# Patient Record
Sex: Male | Born: 1985 | Race: Black or African American | Hispanic: No | Marital: Single | Smoking: Current every day smoker
Health system: Southern US, Community
[De-identification: ages and names within clinical notes are randomized; demographics above are authoritative.]

## PROBLEM LIST (undated history)

## (undated) DIAGNOSIS — E119 Type 2 diabetes mellitus without complications: Secondary | ICD-10-CM

## (undated) DIAGNOSIS — I10 Essential (primary) hypertension: Secondary | ICD-10-CM

## (undated) DIAGNOSIS — K5792 Diverticulitis of intestine, part unspecified, without perforation or abscess without bleeding: Secondary | ICD-10-CM

## (undated) HISTORY — DX: Diverticulitis of intestine, part unspecified, without perforation or abscess without bleeding: K57.92

---

## 2016-03-14 ENCOUNTER — Emergency Department: Payer: Self-pay

## 2016-03-14 ENCOUNTER — Inpatient Hospital Stay
Admission: EM | Admit: 2016-03-14 | Discharge: 2016-03-17 | DRG: 305 | Disposition: A | Discharge status: Home / Self Care | Payer: Self-pay | Attending: Internal Medicine | Admitting: Internal Medicine

## 2016-03-14 ENCOUNTER — Encounter: Payer: Self-pay | Admitting: Emergency Medicine

## 2016-03-14 DIAGNOSIS — R778 Other specified abnormalities of plasma proteins: Secondary | ICD-10-CM

## 2016-03-14 DIAGNOSIS — R7989 Other specified abnormal findings of blood chemistry: Secondary | ICD-10-CM

## 2016-03-14 DIAGNOSIS — I160 Hypertensive urgency: Principal | ICD-10-CM | POA: Diagnosis present

## 2016-03-14 DIAGNOSIS — E876 Hypokalemia: Secondary | ICD-10-CM | POA: Diagnosis present

## 2016-03-14 DIAGNOSIS — I209 Angina pectoris, unspecified: Secondary | ICD-10-CM | POA: Diagnosis present

## 2016-03-14 DIAGNOSIS — F17210 Nicotine dependence, cigarettes, uncomplicated: Secondary | ICD-10-CM | POA: Diagnosis present

## 2016-03-14 DIAGNOSIS — R079 Chest pain, unspecified: Secondary | ICD-10-CM

## 2016-03-14 DIAGNOSIS — F1290 Cannabis use, unspecified, uncomplicated: Secondary | ICD-10-CM | POA: Diagnosis present

## 2016-03-14 DIAGNOSIS — Z6841 Body Mass Index (BMI) 40.0 and over, adult: Secondary | ICD-10-CM

## 2016-03-14 DIAGNOSIS — I10 Essential (primary) hypertension: Secondary | ICD-10-CM | POA: Diagnosis present

## 2016-03-14 DIAGNOSIS — Y636 Underdosing and nonadministration of necessary drug, medicament or biological substance: Secondary | ICD-10-CM | POA: Diagnosis present

## 2016-03-14 DIAGNOSIS — E669 Obesity, unspecified: Secondary | ICD-10-CM | POA: Diagnosis present

## 2016-03-14 DIAGNOSIS — F17200 Nicotine dependence, unspecified, uncomplicated: Secondary | ICD-10-CM | POA: Diagnosis present

## 2016-03-14 DIAGNOSIS — E119 Type 2 diabetes mellitus without complications: Secondary | ICD-10-CM | POA: Diagnosis present

## 2016-03-14 HISTORY — DX: Essential (primary) hypertension: I10

## 2016-03-14 LAB — BASIC METABOLIC PANEL
Anion gap: 8 (ref 5–15)
BUN: 13 mg/dL (ref 6–20)
CALCIUM: 9.2 mg/dL (ref 8.9–10.3)
CO2: 30 mmol/L (ref 22–32)
CREATININE: 0.95 mg/dL (ref 0.61–1.24)
Chloride: 101 mmol/L (ref 101–111)
GFR calc non Af Amer: >60 mL/min (ref >60)
Glucose, Bld: 176 mg/dL — ABNORMAL HIGH (ref 65–99)
Potassium: 3 mmol/L — ABNORMAL LOW (ref 3.5–5.1)
SODIUM: 139 mmol/L (ref 135–145)

## 2016-03-14 LAB — CBC
HCT: 43.6 % (ref 40.0–52.0)
Hemoglobin: 15.2 g/dL (ref 13.0–18.0)
MCH: 29 pg (ref 26.0–34.0)
MCHC: 35 g/dL (ref 32.0–36.0)
MCV: 83.1 fL (ref 80.0–100.0)
PLATELETS: 248 10*3/uL (ref 150–440)
RBC: 5.25 MIL/uL (ref 4.40–5.90)
RDW: 13.8 % (ref 11.5–14.5)
WBC: 9.5 10*3/uL (ref 3.8–10.6)

## 2016-03-14 LAB — LIPID PANEL
Cholesterol: 191 mg/dL (ref 0–200)
HDL: 39 mg/dL — AB (ref >40)
LDL CALC: 126 mg/dL — AB (ref 0–99)
Total CHOL/HDL Ratio: 4.9 RATIO
Triglycerides: 128 mg/dL (ref <150)
VLDL: 26 mg/dL (ref 0–40)

## 2016-03-14 LAB — RAPID HIV SCREEN (HIV 1/2 AB+AG)
HIV 1/2 Antibodies: NONREACTIVE
HIV-1 P24 ANTIGEN - HIV24: NONREACTIVE

## 2016-03-14 LAB — PROTIME-INR
INR: 0.95
Prothrombin Time: 12.7 seconds (ref 11.4–15.2)

## 2016-03-14 LAB — TROPONIN I
TROPONIN I: 0.05 ng/mL — AB (ref <0.03)
TROPONIN I: 0.06 ng/mL — AB (ref <0.03)
Troponin I: 0.04 ng/mL (ref <0.03)
Troponin I: 0.04 ng/mL (ref <0.03)

## 2016-03-14 LAB — MAGNESIUM: MAGNESIUM: 1.9 mg/dL (ref 1.7–2.4)

## 2016-03-14 LAB — APTT: APTT: 33 s (ref 24–36)

## 2016-03-14 MED ORDER — ASPIRIN 81 MG PO CHEW: 324.0000 mg | CHEWABLE_TABLET | Freq: Once | ORAL | Status: DC

## 2016-03-14 MED ORDER — ASPIRIN 81 MG PO CHEW
CHEWABLE_TABLET | ORAL | Status: AC
  Administered 2016-03-14: 324 mg via ORAL
  Filled 2016-03-14: qty 4

## 2016-03-14 MED ORDER — SODIUM CHLORIDE 0.9% FLUSH
3.0000 mL | Freq: Two times a day (BID) | INTRAVENOUS | Status: DC
  Administered 2016-03-14 – 2016-03-17 (×6): 3 mL via INTRAVENOUS

## 2016-03-14 MED ORDER — ALPRAZOLAM 0.25 MG PO TABS
0.2500 mg | ORAL_TABLET | Freq: Once | ORAL | Status: AC
  Administered 2016-03-15: 0.25 mg via ORAL
  Filled 2016-03-14: qty 1

## 2016-03-14 MED ORDER — HYDROCHLOROTHIAZIDE 25 MG PO TABS: 25.0000 mg | ORAL_TABLET | Freq: Every day | ORAL | Status: DC

## 2016-03-14 MED ORDER — METOPROLOL TARTRATE 25 MG PO TABS
25.0000 mg | ORAL_TABLET | Freq: Two times a day (BID) | ORAL | Status: DC
  Administered 2016-03-14 (×2): 25 mg via ORAL
  Filled 2016-03-14 (×2): qty 1

## 2016-03-14 MED ORDER — HYDROCHLOROTHIAZIDE 25 MG PO TABS
25.0000 mg | ORAL_TABLET | Freq: Every day | ORAL | Status: DC
  Administered 2016-03-14 – 2016-03-16 (×3): 25 mg via ORAL
  Filled 2016-03-14 (×3): qty 1

## 2016-03-14 MED ORDER — DOCUSATE SODIUM 100 MG PO CAPS: 100.0000 mg | ORAL_CAPSULE | Freq: Two times a day (BID) | ORAL | Status: DC | PRN

## 2016-03-14 MED ORDER — LABETALOL HCL 5 MG/ML IV SOLN
10.0000 mg | Freq: Once | INTRAVENOUS | Status: AC
  Administered 2016-03-14: 10 mg via INTRAVENOUS

## 2016-03-14 MED ORDER — FUROSEMIDE 10 MG/ML IJ SOLN
20.0000 mg | Freq: Once | INTRAMUSCULAR | Status: AC
  Administered 2016-03-14: 20 mg via INTRAVENOUS
  Filled 2016-03-14: qty 4

## 2016-03-14 MED ORDER — LABETALOL HCL 5 MG/ML IV SOLN
5.0000 mg | Freq: Once | INTRAVENOUS | Status: AC
  Administered 2016-03-14: 5 mg via INTRAVENOUS
  Filled 2016-03-14: qty 4

## 2016-03-14 MED ORDER — HYDRALAZINE HCL 20 MG/ML IJ SOLN
10.0000 mg | Freq: Four times a day (QID) | INTRAMUSCULAR | Status: DC | PRN
  Administered 2016-03-14 – 2016-03-15 (×3): 10 mg via INTRAVENOUS
  Filled 2016-03-14 (×3): qty 1

## 2016-03-14 MED ORDER — POTASSIUM CHLORIDE CRYS ER 20 MEQ PO TBCR
40.0000 meq | EXTENDED_RELEASE_TABLET | Freq: Two times a day (BID) | ORAL | Status: DC
  Administered 2016-03-14 – 2016-03-16 (×6): 40 meq via ORAL
  Filled 2016-03-14 (×6): qty 2

## 2016-03-14 MED ORDER — LISINOPRIL 10 MG PO TABS: 10.0000 mg | ORAL_TABLET | Freq: Once | ORAL | Status: DC

## 2016-03-14 MED ORDER — ASPIRIN 81 MG PO CHEW
324.0000 mg | CHEWABLE_TABLET | Freq: Once | ORAL | Status: AC
  Administered 2016-03-14: 324 mg via ORAL

## 2016-03-14 MED ORDER — LABETALOL HCL 5 MG/ML IV SOLN
10.0000 mg | Freq: Once | INTRAVENOUS | Status: AC
  Administered 2016-03-14: 10 mg via INTRAVENOUS
  Filled 2016-03-14: qty 4

## 2016-03-14 MED ORDER — ACETAMINOPHEN 325 MG PO TABS
650.0000 mg | ORAL_TABLET | ORAL | Status: DC | PRN
  Administered 2016-03-14 – 2016-03-15 (×3): 650 mg via ORAL
  Filled 2016-03-14 (×3): qty 2

## 2016-03-14 NOTE — Progress Notes (Signed)
[facility]         [place], [place]   03/14/2016  Patient: Deborha PaymentMichael Lahti   Date of Birth:  [birth year]  Date of admission:  03/14/2016  Date of Discharge  to be decided   To Whom it May Concern:   Deborha PaymentMichael [place]ared[place] is being admitted to hospital for his medical issues.  If you have any questions or concerns, please don't hesitate to call.  Sincerely,   Altamese DillingVACHHANI, Anistyn Graddy M.D Pager [phone]- [phone] Office : [phone] Hospital: [phone]   .

## 2016-03-14 NOTE — ED Provider Notes (Signed)
[facility] Emergency Department Provider Note  ____________________________________________   I have reviewed the triage vital signs and the nursing notes.   HISTORY  Chief Complaint Hypertension    HPI Ricky Farrell is a 31 y.o. male with a history of hypertension who has not been on his blood pressure medications for several months she states because of cost issues presents today complaining of elevated blood pressure. He states he did have a headache over the last couple days. Not worst headache of life but he notes that his blood pressures up when he gets a headache like that. Denies any focal numbness or weakness per her headache is gone this morning he also states that he had a "stitch" in the left side of his chest yesterday evening. It was gone when he woke up this morning. These 2 things however have brought him into the hospital. At this time he has no chest pain or shortness of breath. He states his baseline blood pressures "high" but is not sure exactly how high. Usually starts with a one however.  The "stitch" in his left chest was nonradiating, it was present at rest with no shortness of breath and no pleuritic changes.   Past Medical History:  Diagnosis Date  . Hypertension     There are no active problems to display for this patient.   History reviewed. No pertinent surgical history.  Prior to Admission medications   Not on File    Allergies Patient has no known allergies.  No family history on file.  Social History Social History  Substance Use Topics  . Smoking status: Current Every Day Smoker    Packs/day: 0.10    Types: Cigarettes  . Smokeless tobacco: Not on file  . Alcohol use Not on file    Review of Systems Constitutional: No fever/chills Eyes: No visual changes. ENT: No sore throat. No stiff neck no neck pain Cardiovascular: See history of present illness chest pain. Respiratory: Denies shortness of  breath. Gastrointestinal:   no vomiting.  No diarrhea.  No constipation. Genitourinary: Negative for dysuria. Musculoskeletal: Negative lower extremity swelling Skin: Negative for rash. Neurological: Negative for severe headaches, focal weakness or numbness. 10-point ROS otherwise negative.  ____________________________________________   PHYSICAL EXAM:  VITAL SIGNS: ED Triage Vitals  Enc Vitals Group     BP 03/14/16 0809 (!) 200/139     Pulse Rate 03/14/16 0809 (!) 102     Resp 03/14/16 0809 18     Temp 03/14/16 0809 97.7 F (36.5 C)     Temp Source 03/14/16 0809 Oral     SpO2 03/14/16 0809 98 %     Weight 03/14/16 0810 250 lb (113.4 kg)     Height 03/14/16 0810 5\' 7"  (1.702 m)     Head Circumference --      Peak Flow --      Pain Score 03/14/16 0811 5     Pain Loc --      Pain Edu? --      Excl. in GC? --     Constitutional: Alert and oriented. Well appearing and in no acute distress. Eyes: Conjunctivae are normal. PERRL. EOMI. Head: Atraumatic. Nose: No congestion/rhinnorhea. Mouth/Throat: Mucous membranes are moist.  Oropharynx non-erythematous. Neck: No stridor.   Nontender with no meningismus Cardiovascular: Normal rate, regular rhythm. Grossly normal heart sounds.  Good peripheral circulation. Respiratory: Normal respiratory effort.  No retractions. Lungs CTAB. Abdominal: Soft and nontender. No distention. No guarding no rebound Back:  There is no focal tenderness or step off.  there is no midline tenderness there are no lesions noted. there is no CVA tenderness Musculoskeletal: No lower extremity tenderness, no upper extremity tenderness. No joint effusions, no DVT signs strong distal pulses no edema Neurologic:  Normal speech and language. No gross focal neurologic deficits are appreciated.  Skin:  Skin is warm, dry and intact. No rash noted. Psychiatric: Mood and affect are normal. Speech and behavior are normal.  ____________________________________________    LABS (all labs ordered are listed, but only abnormal results are displayed)  Labs Reviewed  BASIC METABOLIC PANEL - Abnormal; Notable for the following:       Result Value   Potassium 3.0 (*)    Glucose, Bld 176 (*)    All other components within normal limits  TROPONIN I - Abnormal; Notable for the following:    Troponin I 0.05 (*)    All other components within normal limits  CBC  PROTIME-INR  APTT   ____________________________________________  EKG  I personally interpreted any EKGs ordered by me or triage Sinus rhythm rate 94 bpm, ST depression in V4 5 and 6 possible strain pattern also ST depression in 3 and aVF. ____________________________________________  RADIOLOGY  I reviewed any imaging ordered by me or triage that were performed during my shift and, if possible, patient and/or family made aware of any abnormal findings. ____________________________________________   PROCEDURES  Procedure(s) performed: None  Procedures  Critical Care performed: CRITICAL CARE Performed by: Jeanmarie Plant   Total critical care time: 40 minutes  Critical care time was exclusive of separately billable procedures and treating other patients.  Critical care was necessary to treat or prevent imminent or life-threatening deterioration.  Critical care was time spent personally by me on the following activities: development of treatment plan with patient and/or surrogate as well as nursing, discussions with consultants, evaluation of patient's response to treatment, examination of patient, obtaining history from patient or surrogate, ordering and performing treatments and interventions, ordering and review of laboratory studies, ordering and review of radiographic studies, pulse oximetry and re-evaluation of patient's condition.   ____________________________________________   INITIAL IMPRESSION / ASSESSMENT AND PLAN / ED COURSE  Pertinent labs & imaging results that were  available during my care of the patient were reviewed by me and considered in my medical decision making (see chart for details).  Patient with elements of hypertensive urgency. He is not having ongoing chest pain and have an overnight. Blood pressure was initially quite elevated we have brought it down with IV labetalol. Patient has had a headache. He now discussed a CT scan. He is neurologically intact and the stomach for not to have a CT scan. He understands the risk benefits alternatives of both courses of action including having the CT scan not having a CT scan. Patient does have EKG changes. Some of this could be a chronic strain pattern however we do not have any old EKGs here for comparison. Given this and his elevated blood pressure with chest discomfort, although the chest discomfort has resolved, I think the patient meets criteria for hypertensive urgency, we have therefore giving him IV medication Raynaud's blood pressure, we are giving him aspirin and we will admit him. He remains well appearing at this time with no chest pain or headache. Hospitalist agrees with management. There is certainly possibly the the patient's symptoms represents demand ischemia or even ACS.    ____________________________________________   FINAL CLINICAL IMPRESSION(S) / ED DIAGNOSES  Final diagnoses:  None      This chart was dictated using voice recognition software.  Despite best efforts to proofread,  errors can occur which can change meaning.      Jeanmarie PlantJames A McShane, MD 03/14/16 [phone]

## 2016-03-14 NOTE — H&P (Signed)
[facility] [place] [facility]   PATIENT NAME: Ricky Farrell    MR#:  [id number]  DATE OF BIRTH:  [birth year]  DATE OF ADMISSION:  03/14/2016  PRIMARY CARE PHYSICIAN: None.  REQUESTING/REFERRING PHYSICIAN: McShane  CHIEF COMPLAINT:   Chief Complaint  Patient presents with  . Hypertension    HISTORY OF PRESENT ILLNESS: Ricky Farrell  is a 31 y.o. male with a known history of Htn, was prescribed HCTZ and Lisinopril, but stopped lisinopril after reading its side effects online. [place] and so than out of his hydrochlorothiazide and since then has not seen any doctors so for last 3-4 month he is not taking any medicine for his high blood pressure. Lately he is also feeling more weak or tired while doing his workout or playing, last night he was having on and off chest pain on left side of his chest so concerned with this he came to emergency room today. He was noted to have very high blood pressure and borderline elevated troponin so he is given for admission to hospitalist team. He also confirms using marijuana one day before coming here.  PAST MEDICAL HISTORY:   Past Medical History:  Diagnosis Date  . Hypertension     PAST SURGICAL HISTORY: History reviewed. No pertinent surgical history.  SOCIAL HISTORY:  Social History  Substance Use Topics  . Smoking status: Current Every Day Smoker    Packs/day: 0.10    Types: Cigarettes  . Smokeless tobacco: Not on file  . Alcohol use Not on file    FAMILY HISTORY:  Family History  Problem Relation Age of Onset  . Hypertension Mother   . Diabetes Mother     DRUG ALLERGIES: No Known Allergies  REVIEW OF SYSTEMS:   CONSTITUTIONAL: No fever, fatigue or weakness.  EYES: No blurred or double vision.  EARS, NOSE, AND THROAT: No tinnitus or ear pain.  RESPIRATORY: No cough, shortness of breath, wheezing or hemoptysis.  CARDIOVASCULAR: Positive for chest pain, no orthopnea, edema.   GASTROINTESTINAL: No nausea, vomiting, diarrhea or abdominal pain.  GENITOURINARY: No dysuria, hematuria.  ENDOCRINE: No polyuria, nocturia,  HEMATOLOGY: No anemia, easy bruising or bleeding SKIN: No rash or lesion. MUSCULOSKELETAL: No joint pain or arthritis.   NEUROLOGIC: No tingling, numbness, weakness.  PSYCHIATRY: No anxiety or depression.   MEDICATIONS AT HOME:  Prior to Admission medications   Not on File      PHYSICAL EXAMINATION:   VITAL SIGNS: Blood pressure (!) 179/155, pulse 86, temperature 97.7 F (36.5 C), temperature source Oral, resp. rate (!) 22, height 5\' 7"  (1.702 m), weight 113.4 kg (250 lb), SpO2 96 %.  GENERAL:  31 y.o.-year-old patient lying in the bed with no acute distress.  EYES: Pupils equal, round, reactive to light and accommodation. No scleral icterus. Extraocular muscles intact.  HEENT: Head atraumatic, normocephalic. Oropharynx and nasopharynx clear.  NECK:  Supple, no jugular venous distention. No thyroid enlargement, no tenderness.  LUNGS: Normal breath sounds bilaterally, no wheezing, rales,rhonchi or crepitation. No use of accessory muscles of respiration.  CARDIOVASCULAR: S1, S2 normal. No murmurs, rubs, or gallops.  ABDOMEN: Soft, nontender, nondistended. Bowel sounds present. No organomegaly or mass.  EXTREMITIES: No pedal edema, cyanosis, or clubbing.  NEUROLOGIC: Cranial nerves II through XII are intact. Muscle strength 5/5 in all extremities. Sensation intact. Gait not checked.  PSYCHIATRIC: The patient is alert and oriented x 3.  SKIN: No obvious rash, lesion, or ulcer.  LABORATORY PANEL:   CBC  Recent Labs Lab 03/14/16 0826  WBC 9.5  HGB 15.2  HCT 43.6  PLT 248  MCV 83.1  MCH 29.0  MCHC 35.0  RDW 13.8   ------------------------------------------------------------------------------------------------------------------  Chemistries   Recent Labs Lab 03/14/16 0826  NA 139  K 3.0*  CL 101  CO2 30  GLUCOSE 176*  BUN  13  CREATININE 0.95  CALCIUM 9.2   ------------------------------------------------------------------------------------------------------------------ estimated creatinine clearance is 134.2 mL/min (by C-G formula based on SCr of 0.95 mg/dL). ------------------------------------------------------------------------------------------------------------------ No results for input(s): TSH, T4TOTAL, T3FREE, THYROIDAB in the last 72 hours.  Invalid input(s): FREET3   Coagulation profile  Recent Labs Lab 03/14/16 0826  INR 0.95   ------------------------------------------------------------------------------------------------------------------- No results for input(s): DDIMER in the last 72 hours. -------------------------------------------------------------------------------------------------------------------  Cardiac Enzymes  Recent Labs Lab 03/14/16 0826  TROPONINI 0.05*   ------------------------------------------------------------------------------------------------------------------ Invalid input(s): POCBNP  ---------------------------------------------------------------------------------------------------------------  Urinalysis No results found for: COLORURINE, APPEARANCEUR, LABSPEC, PHURINE, GLUCOSEU, HGBUR, BILIRUBINUR, KETONESUR, PROTEINUR, UROBILINOGEN, NITRITE, LEUKOCYTESUR   RADIOLOGY: Dg Chest Port 1 View  Result Date: 03/14/2016 CLINICAL DATA:  Pt reports "cramping" on left side of upper abdomen x3 days, reports increased stress, is out of lisinopril and hydrochlorothiazide. Pt denies CP, SHOB. EXAM: PORTABLE CHEST 1 VIEW COMPARISON:  None. FINDINGS: Heart is enlarged. There are no focal consolidations or pleural effusions. No pulmonary edema. IMPRESSION: Cardiomegaly. Electronically Signed   By: Norva Pavlov M.D.   On: 03/14/2016 09:48    EKG: Orders placed or performed during the hospital encounter of 03/14/16  . ED EKG within 10 minutes  . ED EKG within 10  minutes  . EKG 12-Lead  . EKG 12-Lead  . EKG 12-Lead  . EKG 12-Lead    IMPRESSION AND PLAN:  * Hypertensive urgency   Anginal chest pain secondary to uncontrolled hypertension    IV labetalol given in the ER with some help, I will give IV hydralazine and we'll start on oral hydrochlorothiazide and metoprolol for now.   I counseled him about dietary modifications and he seems to be motivated and told he will follow that.   I will monitor him on telemetry and follow serial troponin.  * Elevated troponin   Likely secondary to hypertensive urgency, follow serial troponin and monitor on telemetry.   Get echocardiogram.  * Possible cardiomyopathy   X-ray chest shows cardiac enlargement without pulmonary edema.   Most likely secondary to long-standing uncontrolled hypertension.   We'll get echocardiogram for further details and adjust medications on discharge according to that.   We'll also check lipid panel and hemoglobin A1c  * Active smoking   Counseled to quit smoking for 4 minutes and offered nicotine patch.  All the records are reviewed and case discussed with ED provider. Management plans discussed with the patient, family and they are in agreement.  CODE STATUS: Full code Code Status History    This patient does not have a recorded code status. Please follow your organizational policy for patients in this situation.       TOTAL TIME TAKING CARE OF THIS PATIENT: 50 minutes.    Altamese Dilling M.D on 03/14/2016   Between 7am to 6pm - Pager - [phone]  After 6pm go to [email or web address] - Social research officer, government  [facility]  Office  [phone]  CC: Primary care physician; No primary care provider on file.   Note: This dictation was prepared with Dragon dictation along with smaller phrase technology. Any transcriptional errors that result from this  process are unintentional.

## 2016-03-14 NOTE — ED Triage Notes (Signed)
States has history of high blood pressure and has been off of meds. Recent move and has not established care.

## 2016-03-14 NOTE — ED Notes (Signed)
Pt reports "cramping" on left side of upper abdomen x3 days, reports increased stress, is out of lisinopril and hydrochlorothiazide. Pt denies CP, SHOB.

## 2016-03-14 NOTE — Progress Notes (Signed)
31 yo bm admitted to room 251 from ED with hypertensive emergency.  A&O x 3, gait steady.  No distress on ra.  Cardiac monitor place on pt and verified with Jamie,CNA.  Pt denies chest pain at this time.  Lungs diminished bil.  SL lt ac flushes well.  Skin intact, checked with Amy, RN.  Oriented to room and surroundings, POC reviewed with pt.  Denies need at this time.  CB in reach, SR up x 2.

## 2016-03-14 NOTE — Plan of Care (Signed)
Problem: Safety: Goal: Ability to remain free from injury will improve Outcome: Progressing Independent in room  Problem: Pain Managment: Goal: General experience of comfort will improve Outcome: Progressing Prn medications

## 2016-03-15 ENCOUNTER — Inpatient Hospital Stay
Admit: 2016-03-15 | Discharge: 2016-03-15 | Disposition: A | Discharge status: Home / Self Care | Payer: Self-pay | Attending: Internal Medicine | Admitting: Internal Medicine

## 2016-03-15 DIAGNOSIS — R079 Chest pain, unspecified: Secondary | ICD-10-CM

## 2016-03-15 LAB — CBC
HEMATOCRIT: 44.8 % (ref 40.0–52.0)
HEMOGLOBIN: 15.6 g/dL (ref 13.0–18.0)
MCH: 29 pg (ref 26.0–34.0)
MCHC: 34.9 g/dL (ref 32.0–36.0)
MCV: 83.1 fL (ref 80.0–100.0)
Platelets: 264 10*3/uL (ref 150–440)
RBC: 5.39 MIL/uL (ref 4.40–5.90)
RDW: 14.3 % (ref 11.5–14.5)
WBC: 11 10*3/uL — AB (ref 3.8–10.6)

## 2016-03-15 LAB — BASIC METABOLIC PANEL
ANION GAP: 9 (ref 5–15)
BUN: 14 mg/dL (ref 6–20)
CHLORIDE: 99 mmol/L — AB (ref 101–111)
CO2: 27 mmol/L (ref 22–32)
Calcium: 9.4 mg/dL (ref 8.9–10.3)
Creatinine, Ser: 0.96 mg/dL (ref 0.61–1.24)
GFR calc Af Amer: >60 mL/min (ref >60)
GLUCOSE: 220 mg/dL — AB (ref 65–99)
POTASSIUM: 3 mmol/L — AB (ref 3.5–5.1)
SODIUM: 135 mmol/L (ref 135–145)

## 2016-03-15 LAB — ECHOCARDIOGRAM COMPLETE
Height: 67 in
WEIGHTICAEL: 4204.8 [oz_av]

## 2016-03-15 LAB — GLUCOSE, CAPILLARY
GLUCOSE-CAPILLARY: 339 mg/dL — AB (ref 65–99)
Glucose-Capillary: 242 mg/dL — ABNORMAL HIGH (ref 65–99)

## 2016-03-15 LAB — HEMOGLOBIN A1C
Hgb A1c MFr Bld: 9.1 % — ABNORMAL HIGH (ref 4.8–5.6)
Mean Plasma Glucose: 214 mg/dL

## 2016-03-15 MED ORDER — CLONIDINE HCL 0.1 MG PO TABS
0.1000 mg | ORAL_TABLET | Freq: Two times a day (BID) | ORAL | Status: DC
  Administered 2016-03-15: 0.1 mg via ORAL
  Filled 2016-03-15 (×2): qty 1

## 2016-03-15 MED ORDER — CLONIDINE HCL 0.1 MG PO TABS
0.2000 mg | ORAL_TABLET | Freq: Once | ORAL | Status: AC
  Administered 2016-03-15: 0.2 mg via ORAL
  Filled 2016-03-15: qty 1

## 2016-03-15 MED ORDER — GLIPIZIDE ER 5 MG PO TB24
5.0000 mg | ORAL_TABLET | Freq: Every day | ORAL | Status: DC
  Administered 2016-03-15 – 2016-03-17 (×3): 5 mg via ORAL
  Filled 2016-03-15 (×3): qty 1

## 2016-03-15 MED ORDER — METOPROLOL TARTRATE 50 MG PO TABS
50.0000 mg | ORAL_TABLET | Freq: Two times a day (BID) | ORAL | Status: DC
  Administered 2016-03-15 – 2016-03-17 (×5): 50 mg via ORAL
  Filled 2016-03-15 (×5): qty 1

## 2016-03-15 MED ORDER — INSULIN ASPART 100 UNIT/ML ~~LOC~~ SOLN
0.0000 [IU] | Freq: Three times a day (TID) | SUBCUTANEOUS | Status: DC
  Administered 2016-03-15: 7 [IU] via SUBCUTANEOUS
  Administered 2016-03-16 (×2): 2 [IU] via SUBCUTANEOUS
  Administered 2016-03-16: 1 [IU] via SUBCUTANEOUS
  Administered 2016-03-17 (×2): 2 [IU] via SUBCUTANEOUS
  Filled 2016-03-15: qty 2
  Filled 2016-03-15: qty 1
  Filled 2016-03-15: qty 2
  Filled 2016-03-15: qty 7
  Filled 2016-03-15 (×2): qty 2

## 2016-03-15 MED ORDER — AMLODIPINE BESYLATE 5 MG PO TABS
5.0000 mg | ORAL_TABLET | Freq: Every day | ORAL | Status: DC
  Administered 2016-03-15: 5 mg via ORAL
  Filled 2016-03-15: qty 1

## 2016-03-15 MED ORDER — PERFLUTREN LIPID MICROSPHERE
1.0000 mL | INTRAVENOUS | Status: AC | PRN
  Administered 2016-03-15: 2 mL via INTRAVENOUS
  Filled 2016-03-15: qty 10

## 2016-03-15 MED ORDER — INSULIN ASPART 100 UNIT/ML ~~LOC~~ SOLN
0.0000 [IU] | Freq: Every day | SUBCUTANEOUS | Status: DC
  Administered 2016-03-15: 2 [IU] via SUBCUTANEOUS
  Filled 2016-03-15 (×2): qty 2

## 2016-03-15 MED ORDER — HYDRALAZINE HCL 20 MG/ML IJ SOLN
20.0000 mg | Freq: Once | INTRAMUSCULAR | Status: AC
  Administered 2016-03-15: 20 mg via INTRAVENOUS
  Filled 2016-03-15: qty 1

## 2016-03-15 NOTE — Progress Notes (Signed)
To echo via bed.

## 2016-03-15 NOTE — Plan of Care (Signed)
Problem: Health Behavior/Discharge Planning: Goal: Ability to manage health-related needs will improve Outcome: Progressing Case management ordered   Problem: Pain Managment: Goal: General experience of comfort will improve Outcome: Progressing Prn medications  Problem: Education: Goal: Ability to describe self-care measures that may prevent or decrease complications (Diabetes Survival Skills Education) will improve Outcome: Progressing Glipizide handout given Diabetes coordinator consult ordered Dietician consult

## 2016-03-15 NOTE — Progress Notes (Signed)
Patient ID: Ricky Farrell, male   DOB: May 26, 1985, 31 y.o.   MRN: [id number]  [facility] PROGRESS NOTE  Ricky Farrell XLK:[id number] DOB: [birth year] DOA: 03/14/2016 PCP: No primary care provider on file.  HPI/Subjective: Patient feels fine. Was admitted with accelerated hypertension. Blood pressure still elevated today. He felt worse after they lowered his blood pressure with IV medication.  Objective: Vitals:   03/15/16 0816 03/15/16 1120  BP: (!) 174/115 (!) 181/120  Pulse: (!) 105 93  Resp: 15 19  Temp: 97.9 F (36.6 C) 98.4 F (36.9 C)    Filed Weights   03/14/16 0810 03/14/16 1349  Weight: 113.4 kg (250 lb) 119.2 kg (262 lb 12.8 oz)    ROS: Review of Systems  Constitutional: Negative for chills and fever.  Eyes: Negative for blurred vision.  Respiratory: Negative for cough and shortness of breath.   Cardiovascular: Negative for chest pain.  Gastrointestinal: Negative for constipation, diarrhea, nausea and vomiting.  Genitourinary: Negative for dysuria.  Musculoskeletal: Negative for joint pain.  Neurological: Positive for headaches. Negative for dizziness.   Exam: Physical Exam  Constitutional: He is oriented to person, place, and time.  HENT:  Nose: No mucosal edema.  Mouth/Throat: No oropharyngeal exudate or posterior oropharyngeal edema.  Eyes: Conjunctivae, EOM and lids are normal. Pupils are equal, round, and reactive to light.  Neck: No JVD present. Carotid bruit is not present. No edema present. No thyroid mass and no thyromegaly present.  Cardiovascular: S1 normal and S2 normal.  Exam reveals no gallop.   No murmur heard. Pulses:      Dorsalis pedis pulses are 2+ on the right side, and 2+ on the left side.  Respiratory: No respiratory distress. He has no wheezes. He has no rhonchi. He has no rales.  GI: Soft. Bowel sounds are normal. There is no tenderness.  Musculoskeletal:       Right ankle: He exhibits no swelling.       Left ankle: He  exhibits no swelling.  Lymphadenopathy:    He has no cervical adenopathy.  Neurological: He is alert and oriented to person, place, and time. No cranial nerve deficit.  Skin: Skin is warm. No rash noted. Nails show no clubbing.  Psychiatric: He has a normal mood and affect.      Data Reviewed: Basic Metabolic Panel:  Recent Labs Lab 03/14/16 0826 03/14/16 1501 03/15/16 0602  NA 139  --  135  K 3.0*  --  3.0*  CL 101  --  99*  CO2 30  --  27  GLUCOSE 176*  --  220*  BUN 13  --  14  CREATININE 0.95  --  0.96  CALCIUM 9.2  --  9.4  MG  --  1.9  --    CBC:  Recent Labs Lab 03/14/16 0826 03/15/16 0602  WBC 9.5 11.0*  HGB 15.2 15.6  HCT 43.6 44.8  MCV 83.1 83.1  PLT 248 264   Cardiac Enzymes:  Recent Labs Lab 03/14/16 0826 03/14/16 1123 03/14/16 1501 03/14/16 1851  TROPONINI 0.05* 0.06* 0.04* 0.04*     Studies: Dg Chest Port 1 View  Result Date: 03/14/2016 CLINICAL DATA:  Pt reports "cramping" on left side of upper abdomen x3 days, reports increased stress, is out of lisinopril and hydrochlorothiazide. Pt denies CP, SHOB. EXAM: PORTABLE CHEST 1 VIEW COMPARISON:  None. FINDINGS: Heart is enlarged. There are no focal consolidations or pleural effusions. No pulmonary edema. IMPRESSION: Cardiomegaly. Electronically Signed   By: Lanora Manis  Manson PasseyBrown M.D.   On: 03/14/2016 09:48    Scheduled Meds: . amLODipine  5 mg Oral Daily  . cloNIDine  0.1 mg Oral BID  . hydrochlorothiazide  25 mg Oral Daily  . metoprolol tartrate  50 mg Oral BID  . potassium chloride  40 mEq Oral BID  . sodium chloride flush  3 mL Intravenous Q12H    Assessment/Plan:  1. Accelerated hypertension. This morning I increase metoprolol to 50 mg twice a day and added amlodipine. This afternoon I added clonidine. Check vitals every 4 hours while awake. Send a renin and aldosterone.  2. Type 2 diabetes mellitus. Hemoglobin A1c elevated at 9.1. Low carbohydrate diet exercise and weight loss explained  to the patient. Start low-dose glipizide 5 mg daily. 3. Hypokalemia replace potassium orally 4. Obesity weight loss needed 5. Patient also needs to stop smoking  Code Status:     Code Status Orders        Start     Ordered   03/14/16 1349  Full code  Continuous     03/14/16 1348    Code Status History    Date Active Date Inactive Code Status Order ID Comments User Context   This patient has a current code status but no historical code status.     Disposition Plan: Home once blood pressure better  Time spent: 30 minutes  Alford HighlandWIETING, Telly Broberg  [employer]

## 2016-03-15 NOTE — Progress Notes (Signed)
Back from echo

## 2016-03-15 NOTE — Progress Notes (Signed)
Called Dr. Tobi BastosPyreddy regarding patient's elevated blood pressure.  Appropriate orders were placed for one time dose of 20mg  of hydralazine.  Arturo MortonClay, Lanee Chain N   03/15/2016  5:09 AM

## 2016-03-16 LAB — GLUCOSE, CAPILLARY
GLUCOSE-CAPILLARY: 199 mg/dL — AB (ref 65–99)
GLUCOSE-CAPILLARY: 160 mg/dL — AB (ref 65–99)
GLUCOSE-CAPILLARY: 200 mg/dL — AB (ref 65–99)
Glucose-Capillary: 121 mg/dL — ABNORMAL HIGH (ref 65–99)
Glucose-Capillary: 122 mg/dL — ABNORMAL HIGH (ref 65–99)

## 2016-03-16 LAB — BASIC METABOLIC PANEL
ANION GAP: 8 (ref 5–15)
BUN: 19 mg/dL (ref 6–20)
CALCIUM: 9.2 mg/dL (ref 8.9–10.3)
CHLORIDE: 100 mmol/L — AB (ref 101–111)
CO2: 28 mmol/L (ref 22–32)
CREATININE: 1.1 mg/dL (ref 0.61–1.24)
GFR calc Af Amer: >60 mL/min (ref >60)
GFR calc non Af Amer: >60 mL/min (ref >60)
GLUCOSE: 177 mg/dL — AB (ref 65–99)
Potassium: 3.2 mmol/L — ABNORMAL LOW (ref 3.5–5.1)
Sodium: 136 mmol/L (ref 135–145)

## 2016-03-16 MED ORDER — SPIRONOLACTONE 25 MG PO TABS
25.0000 mg | ORAL_TABLET | Freq: Every day | ORAL | Status: DC
  Administered 2016-03-16 – 2016-03-17 (×2): 25 mg via ORAL
  Filled 2016-03-16 (×2): qty 1

## 2016-03-16 MED ORDER — GUAIFENESIN-DM 100-10 MG/5ML PO SYRP
5.0000 mL | ORAL_SOLUTION | ORAL | Status: DC | PRN
  Administered 2016-03-16: 5 mL via ORAL
  Filled 2016-03-16: qty 5

## 2016-03-16 MED ORDER — CLONIDINE HCL 0.1 MG PO TABS
0.2000 mg | ORAL_TABLET | Freq: Once | ORAL | Status: AC
  Administered 2016-03-16: 0.2 mg via ORAL

## 2016-03-16 MED ORDER — AMLODIPINE BESYLATE 10 MG PO TABS
10.0000 mg | ORAL_TABLET | Freq: Every day | ORAL | Status: DC
  Administered 2016-03-16: 10 mg via ORAL
  Filled 2016-03-16: qty 1

## 2016-03-16 MED ORDER — LIVING WELL WITH DIABETES BOOK
Freq: Once | Status: AC
  Administered 2016-03-16: 14:00:00
  Filled 2016-03-16: qty 1

## 2016-03-16 MED ORDER — ENALAPRIL MALEATE 10 MG PO TABS
10.0000 mg | ORAL_TABLET | Freq: Two times a day (BID) | ORAL | Status: DC
  Administered 2016-03-16 – 2016-03-17 (×2): 10 mg via ORAL
  Filled 2016-03-16 (×2): qty 1

## 2016-03-16 MED ORDER — CLONIDINE HCL 0.1 MG PO TABS
0.2000 mg | ORAL_TABLET | Freq: Three times a day (TID) | ORAL | Status: DC
  Administered 2016-03-16 – 2016-03-17 (×3): 0.2 mg via ORAL
  Filled 2016-03-16 (×5): qty 2

## 2016-03-16 MED ORDER — ENALAPRIL MALEATE 5 MG PO TABS
5.0000 mg | ORAL_TABLET | Freq: Two times a day (BID) | ORAL | Status: DC
  Administered 2016-03-16: 5 mg via ORAL
  Filled 2016-03-16: qty 1

## 2016-03-16 NOTE — Progress Notes (Signed)
Pt complaining of congestion and sneezing/cough, Guaifenesin has been ordered and administered per standing order, will keep monitoring.

## 2016-03-16 NOTE — Progress Notes (Signed)
Nutrition Education Brief Note   RD consulted for nutrition education regarding diabetic diet and low sodium diet.  RD provided "Low Sodium Nutrition Therapy" and "Carbohydrate Counting for People with Diabetes"  handouts from the Academy of Nutrition and Dietetics. Reviewed patient's dietary recall.   Pt reports consuming 1 large meal a day (up to a whole pizza at 10 PM) and small snacks d/t working night shifts. Encouraged patient to consume small meals with protein throughout the day to not only increase satiation, but also aid in blood sugar maintenance.   Pt reports no recent weight loss and a great appetite.   Pt is new onset diabetic so provided an overview on carbohydrate counting. Discussed amount of grams in a serving of carbohydrates and recommended servings at each meal and snack  Discussed with pt the meals he has been provided since admission are a great reference in portion size. Pt states he has enjoyed the food and "hasn't been starving".  Provided examples on ways to decrease sodium intake in diet. Discouraged intake of processed foods and fast foods. Encouraged fresh fruits and vegetables as well as whole grain sources of carbohydrates to maximize fiber intake.   RD discussed high salt content foods to avoid, focusing on fresh foods, eating processed and prepared foods less often, cooking more often at home, using salt-free seasonings, herbs, and spices to flavor foods, and reading food labels. Teach back method used.  Expect good compliance. Pt's mom is a diabetic and he already discussed a plan with her. Pt was asking relevant questions. Pt states he needs to make changes and has already been thinking of a plan to change once discharged. Pt is planning to follow-up with outpatient RD services.  Body mass index is 41.2 kg/m. Pt meets criteria for morbidly obese based on current BMI.  Summary of recommendations: 1) Introduced carbohydrate counting, discussed amount of grams  in a serving and recommended servings at each meal and snack 2) Decrease portion size - recommended he pay attention to the meals he is provided during admission 3) Recommend small frequent meals to decrease spikes in blood sugars 4) Decrease frequency of fast food consumption and sugary beverages 5) Discussed differences in fresh/frozen foods vs. canned foods for sodium content  6) Use low sodium canned foods and out to eat food choices by checking the menu/food label ahead of time 7) Explained why decreasing sodium and monitoring carbohydrate levels are important to the patient's health   Current diet order is heart healthy/carbohydrate modified, patient is consuming approximately 100% of meals at this time. No further nutrition interventions warranted at this time. If additional nutrition issues arise, please re-consult RD.  Fransisca KaufmannAllison Ioannides Dietetic Intern

## 2016-03-16 NOTE — Progress Notes (Signed)
Patient BP elevated. No IV prn meds. MD notified. Verbal order to give extra dose of clonidine. Will monitor,

## 2016-03-16 NOTE — Progress Notes (Signed)
Inpatient Diabetes Program Recommendations  AACE/ADA: New Consensus Statement on Inpatient Glycemic Control (2015)  Target Ranges:  Prepandial:   less than 140 mg/dL      Peak postprandial:   less than 180 mg/dL (1-2 hours)      Critically ill patients:  140 - 180 mg/dL   Lab Results  Component Value Date   GLUCAP 199 (H) 03/16/2016   HGBA1C 9.1 (H) 03/14/2016    Review of Glycemic Control:  Results for Deborha PaymentNGLE, Rourke (MRN [id number]) as of 03/16/2016 09:35  Ref. Range 03/15/2016 16:41 03/15/2016 21:07 03/16/2016 03:54 03/16/2016 07:42  Glucose-Capillary Latest Ref Range: 65 - 99 mg/dL 409339 (H) 811242 (H) 914121 (H) 199 (H)    Diabetes history: New onset diabetes Outpatient Diabetes medications: None Current orders for Inpatient glycemic control: Novolog sensitive tid with meals and HS, Glucotrol XL 5 mg with breakfast  Inpatient Diabetes Program Recommendations:    Ordered Living Well with diabetes booklet and dietician consult for diet teaching.  May consider also adding Metformin 500 mg bid.   Spoke with patient regarding new diagnosis of diabetes.  Further discussed complications of diabetes and the importance of controlling A1C, Blood pressure and cholesterol.  Discussed A1C results and that it indicates average blood sugars around 213 mg/dL.  Patient seems eager to go home.  Discussed meter and monitoring- told him and girlfriend about glucose meter from [employer] that is approximately 10$.  He needs to follow-up with PCP.  Currently patient does not have insurance.  Note that patient has been seen by dietician.  Will follow.  Thanks, Beryl MeagerJenny Carleton [place], RN, BC-ADM Inpatient Diabetes Coordinator Pager [phone] (8a-5p)

## 2016-03-16 NOTE — Care Management Note (Addendum)
Case Management Note  Patient Details  Name: Kaylum Shrum MRN: [id number] Date of Birth: 1985-04-22  Subjective/Objective:  Met with patient to discuss medication management clinic and [facility]. Application given for both with full explanation on how to access these resources. E-mail sent to [employer] and Zoila Shutter. Verified address and phone number.  Patient was appreciative of assistance.                   Action/Plan:   Expected Discharge Date:                  Expected Discharge Plan:  Home/Self Care  In-House Referral:     Discharge [facility], Medication Assistance  Post Acute Care Choice:    Choice offered to:  Patient  DME Arranged:    DME Agency:     HH Arranged:    [place] Agency:     Status of Service:  Completed, signed off  If discussed at [employer] of Stay Meetings, dates discussed:    Additional Comments:  Jolly Mango, RN 03/16/2016, 2:40 PM

## 2016-03-16 NOTE — Progress Notes (Signed)
Patient ID: Ricky Farrell, male   DOB: 1985-01-28, 31 y.o.   MRN: [id number]  [facility] PROGRESS NOTE  Ricky Farrell JXB:[id number] DOB: 10-11-1985 DOA: 03/14/2016 PCP: No primary care provider on file.  HPI/Subjective: Patient again feels fine but his blood pressure has been resistant to medications added so far. He offers no complaints. He was asking how long he needs to stay in the hospital.  Objective: Vitals:   03/16/16 1111 03/16/16 1113  BP: (!) 167/119 (!) 164/117  Pulse: 91 87  Resp:    Temp:      Filed Weights   03/14/16 0810 03/14/16 1349  Weight: 113.4 kg (250 lb) 119.2 kg (262 lb 12.8 oz)    ROS: Review of Systems  Constitutional: Negative for chills and fever.  Eyes: Negative for blurred vision.  Respiratory: Negative for cough and shortness of breath.   Cardiovascular: Negative for chest pain.  Gastrointestinal: Negative for abdominal pain, constipation, diarrhea, nausea and vomiting.  Genitourinary: Negative for dysuria.  Musculoskeletal: Negative for joint pain.  Neurological: Negative for dizziness and headaches.   Exam: Physical Exam  Constitutional: He is oriented to person, place, and time.  HENT:  Nose: No mucosal edema.  Mouth/Throat: No oropharyngeal exudate or posterior oropharyngeal edema.  Eyes: Conjunctivae, EOM and lids are normal. Pupils are equal, round, and reactive to light.  Neck: No JVD present. Carotid bruit is not present. No edema present. No thyroid mass and no thyromegaly present.  Cardiovascular: S1 normal and S2 normal.  Exam reveals no gallop.   No murmur heard. Pulses:      Dorsalis pedis pulses are 2+ on the right side, and 2+ on the left side.  Respiratory: No respiratory distress. He has no wheezes. He has no rhonchi. He has no rales.  GI: Soft. Bowel sounds are normal. There is no tenderness.  Musculoskeletal:       Right ankle: He exhibits no swelling.       Left ankle: He exhibits no swelling.   Lymphadenopathy:    He has no cervical adenopathy.  Neurological: He is alert and oriented to person, place, and time. No cranial nerve deficit.  Skin: Skin is warm. No rash noted. Nails show no clubbing.  Psychiatric: He has a normal mood and affect.      Data Reviewed: Basic Metabolic Panel:  Recent Labs Lab 03/14/16 0826 03/14/16 1501 03/15/16 0602 03/16/16 0729  NA 139  --  135 136  K 3.0*  --  3.0* 3.2*  CL 101  --  99* 100*  CO2 30  --  27 28  GLUCOSE 176*  --  220* 177*  BUN 13  --  14 19  CREATININE 0.95  --  0.96 1.10  CALCIUM 9.2  --  9.4 9.2  MG  --  1.9  --   --    CBC:  Recent Labs Lab 03/14/16 0826 03/15/16 0602  WBC 9.5 11.0*  HGB 15.2 15.6  HCT 43.6 44.8  MCV 83.1 83.1  PLT 248 264   Cardiac Enzymes:  Recent Labs Lab 03/14/16 0826 03/14/16 1123 03/14/16 1501 03/14/16 1851  TROPONINI 0.05* 0.06* 0.04* 0.04*     Scheduled Meds: . amLODipine  10 mg Oral Daily  . cloNIDine  0.2 mg Oral TID  . enalapril  10 mg Oral BID  . glipiZIDE  5 mg Oral Q breakfast  . hydrochlorothiazide  25 mg Oral Daily  . insulin aspart  0-5 Units Subcutaneous QHS  . insulin  aspart  0-9 Units Subcutaneous TID WC  . living well with diabetes book   Does not apply Once  . metoprolol tartrate  50 mg Oral BID  . potassium chloride  40 mEq Oral BID  . sodium chloride flush  3 mL Intravenous Q12H  . spironolactone  25 mg Oral Daily    Assessment/Plan:  1. Accelerated hypertension. This morning I increased her Norvasc to 10 mg daily. I added low-dose enalapril. This afternoon I added low dose spironolactone. and added amlodipine. Check vitals every 4 hours while awake. Sent renin and aldosterone. Patient felt worse after IV medications given in the emergency room dropping his blood pressure down too quickly. No IV medications for this patient since the patient's blood pressure is been high for a long period of time. 2. Type 2 diabetes mellitus. Hemoglobin A1c  elevated at 9.1. Low carbohydrate diet exercise and weight loss explained to the patient. Started low-dose glipizide 5 mg daily. 3. Hypokalemia replace potassium orally. Check magnesium 4. Obesity weight loss needed 5. Patient also needs to stop smoking  Code Status:     Code Status Orders        Start     Ordered   03/14/16 1349  Full code  Continuous     03/14/16 1348    Code Status History    Date Active Date Inactive Code Status Order ID Comments User Context   This patient has a current code status but no historical code status.     Disposition Plan: Home once blood pressure better  Time spent: 25 minutes  Alford HighlandWIETING, Ricky Farrell  [employer]

## 2016-03-16 NOTE — Plan of Care (Signed)
Problem: Education: Goal: Ability to describe self-care measures that may prevent or decrease complications (Diabetes Survival Skills Education) will improve Outcome: Progressing Early teaching by diabetes nurse and myself.  Needs repetition.

## 2016-03-16 NOTE — Progress Notes (Signed)
Patient is very anxious and concerned by his dual diagnosis of HTN and diabetes.  Requires a lot of reassurance that both problem can be brought back into normal ranges.  He is, however, very motivated to make lifestyle changes to improve his HTN and keep his diabetes under control.  He went through the first 2 chapters of LIVING WITH DIABETES.  He found it informative but also frightening.

## 2016-03-17 LAB — BASIC METABOLIC PANEL
ANION GAP: 8 (ref 5–15)
BUN: 21 mg/dL — ABNORMAL HIGH (ref 6–20)
CHLORIDE: 101 mmol/L (ref 101–111)
CO2: 28 mmol/L (ref 22–32)
Calcium: 9.5 mg/dL (ref 8.9–10.3)
Creatinine, Ser: 1.01 mg/dL (ref 0.61–1.24)
GFR calc Af Amer: >60 mL/min (ref >60)
GFR calc non Af Amer: >60 mL/min (ref >60)
GLUCOSE: 148 mg/dL — AB (ref 65–99)
POTASSIUM: 3.5 mmol/L (ref 3.5–5.1)
Sodium: 137 mmol/L (ref 135–145)

## 2016-03-17 LAB — GLUCOSE, CAPILLARY
Glucose-Capillary: 157 mg/dL — ABNORMAL HIGH (ref 65–99)
Glucose-Capillary: 157 mg/dL — ABNORMAL HIGH (ref 65–99)

## 2016-03-17 MED ORDER — GLIPIZIDE ER 5 MG PO TB24: 5.0000 mg | ORAL_TABLET | Freq: Every day | ORAL | 0 refills | Status: DC

## 2016-03-17 MED ORDER — METOPROLOL TARTRATE 50 MG PO TABS: 50.0000 mg | ORAL_TABLET | Freq: Two times a day (BID) | ORAL | 0 refills | Status: DC

## 2016-03-17 MED ORDER — ENALAPRIL MALEATE 10 MG PO TABS: 10.0000 mg | ORAL_TABLET | Freq: Two times a day (BID) | ORAL | 0 refills | Status: DC

## 2016-03-17 MED ORDER — POTASSIUM CHLORIDE CRYS ER 20 MEQ PO TBCR
40.0000 meq | EXTENDED_RELEASE_TABLET | Freq: Once | ORAL | Status: AC
  Administered 2016-03-17: 40 meq via ORAL
  Filled 2016-03-17: qty 2

## 2016-03-17 MED ORDER — AMLODIPINE BESYLATE 5 MG PO TABS: 5.0000 mg | ORAL_TABLET | Freq: Every day | ORAL | 0 refills | Status: DC

## 2016-03-17 MED ORDER — AMLODIPINE BESYLATE 5 MG PO TABS
5.0000 mg | ORAL_TABLET | Freq: Every day | ORAL | Status: DC
  Administered 2016-03-17: 5 mg via ORAL
  Filled 2016-03-17: qty 1

## 2016-03-17 MED ORDER — CLONIDINE HCL 0.1 MG PO TABS
0.2000 mg | ORAL_TABLET | Freq: Two times a day (BID) | ORAL | Status: DC
  Administered 2016-03-17: 0.2 mg via ORAL
  Filled 2016-03-17: qty 2

## 2016-03-17 MED ORDER — CLONIDINE HCL 0.1 MG PO TABS: 0.1000 mg | ORAL_TABLET | Freq: Two times a day (BID) | ORAL | Status: DC

## 2016-03-17 MED ORDER — SPIRONOLACTONE 25 MG PO TABS: 25.0000 mg | ORAL_TABLET | Freq: Every day | ORAL | 0 refills | Status: DC

## 2016-03-17 MED ORDER — CLONIDINE HCL 0.2 MG PO TABS: 0.2000 mg | ORAL_TABLET | Freq: Two times a day (BID) | ORAL | 0 refills | Status: DC

## 2016-03-17 NOTE — Progress Notes (Signed)
Spoke again with patient regarding diabetes diagnosis.  He states that he plans to lose weight and get back in the gym.  Briefly discussed foot care, monitoring (and gave handout on generic meter purchase), normal blood sugar values, and the importance of follow up. Patient seemed motivated to make changes.  He states he is scared of the complications of diabetes.  I explained that by controlling his diabetes, he can reduce the chance of complications.  He is appreciative of information.    Thanks, Beryl MeagerJenny Worthy Boschert, RN, BC-ADM Inpatient Diabetes Coordinator Pager [phone] (8a-5p)

## 2016-03-17 NOTE — Progress Notes (Signed)
Patient ID: Ricky PaymentMichael Hackleman, male   DOB: [birth year], 31 y.o.   MRN: [id number] [facility] - [place] [facility]        Ricky Farrell was admitted to the Hospital on 03/14/2016 and Discharged  03/17/2016 and should be excused from work/school   for 4  days starting 03/14/2016 , may return to work/school without any restrictions.  Alford HighlandWIETING, Laurena Valko M.D on 03/17/2016,at 3:54 PM  [facility] -  at [facility]    Office  [phone]

## 2016-03-17 NOTE — Discharge Summary (Signed)
[facility] - [place] at [facility]   PATIENT NAME: Ricky Farrell    MR#:  [id number]  DATE OF BIRTH:  July 14, 1985  DATE OF ADMISSION:  03/14/2016 ADMITTING PHYSICIAN: Altamese Dilling, MD  DATE OF DISCHARGE: 03/17/2016  PRIMARY CARE PHYSICIAN: [facility]    ADMISSION DIAGNOSIS:  Hypertensive urgency [I16.0] Troponin I above reference range [R74.8] Chest pain [R07.9]  DISCHARGE DIAGNOSIS:  Active Problems:   Malignant essential hypertension   Chest pain   Hypertensive urgency   SECONDARY DIAGNOSIS:   Past Medical History:  Diagnosis Date  . Hypertension     HOSPITAL COURSE:   1. Accelerated hypertension. Blood pressure last night was actually too low. So I had to cut back on medications this morning. When blood pressure was elevated I had a restart some of the medications. At home the patient takes no medications. The patient will end up going home on Aldactone, enalapril, Norvasc, clonidine and metoprolol. Blood pressure was resistant at first coming down but once Aldactone was added it seemed to come down better. Renin and aldosterone level pending. 2. Type 2 diabetes mellitus. Hemoglobin A1c elevated at 9.1. Low carbohydrate diet, exercise and weight loss explained to the patient. Started low-dose glipizide. He already bought a glucometer. 3. Hypokalemia. Renin and aldosterone sent off and pending. The patient was initially placed on hydrochlorothiazide but I stopped this medication with the hypokalemia. 4. Obesity weight loss needed 5. Patient also needs to stop smoking  DISCHARGE CONDITIONS:   Satisfactory  CONSULTS OBTAINED:   none  DRUG ALLERGIES:  No Known Allergies  DISCHARGE MEDICATIONS:   Current Discharge Medication List    START taking these medications   Details  amLODipine (NORVASC) 5 MG tablet Take 1 tablet (5 mg total) by mouth daily. Qty: 30 tablet, Refills: 0    cloNIDine (CATAPRES) 0.2 MG tablet Take 1 tablet (0.2  mg total) by mouth 2 (two) times daily. Qty: 60 tablet, Refills: 0    enalapril (VASOTEC) 10 MG tablet Take 1 tablet (10 mg total) by mouth 2 (two) times daily. Qty: 60 tablet, Refills: 0    glipiZIDE (GLUCOTROL XL) 5 MG 24 hr tablet Take 1 tablet (5 mg total) by mouth daily with breakfast. Qty: 30 tablet, Refills: 0    metoprolol (LOPRESSOR) 50 MG tablet Take 1 tablet (50 mg total) by mouth 2 (two) times daily. Qty: 60 tablet, Refills: 0    spironolactone (ALDACTONE) 25 MG tablet Take 1 tablet (25 mg total) by mouth daily. Qty: 30 tablet, Refills: 0         DISCHARGE INSTRUCTIONS:   [facility] in about 2 weeks.  If you experience worsening of your admission symptoms, develop shortness of breath, life threatening emergency, suicidal or homicidal thoughts you must seek medical attention immediately by calling 911 or calling your MD immediately  if symptoms less severe.  You Must read complete instructions/literature along with all the possible adverse reactions/side effects for all the Medicines you take and that have been prescribed to you. Take any new Medicines after you have completely understood and accept all the possible adverse reactions/side effects.   Please note  You were cared for by a hospitalist during your hospital stay. If you have any questions about your discharge medications or the care you received while you were in the hospital after you are discharged, you can call the unit and asked to speak with the hospitalist on call if the hospitalist that took  care of you is not available. Once you are discharged, your primary care physician will handle any further medical issues. Please note that NO REFILLS for any discharge medications will be authorized once you are discharged, as it is imperative that you return to your primary care physician (or establish a relationship with a primary care physician if you do not have one) for your aftercare needs so  that they can reassess your need for medications and monitor your lab values.    Today   CHIEF COMPLAINT:   Chief Complaint  Patient presents with  . Hypertension    HISTORY OF PRESENT ILLNESS:  Ricky Farrell  is a 31 y.o. male with a known history of Hypertension presents with elevated blood pressure   VITAL SIGNS:  Blood pressure (!) 145/105, pulse 83, temperature 98.4 F (36.9 C), resp. rate 18, height 5\' 7"  (1.702 m), weight 119.2 kg (262 lb 12.8 oz), SpO2 99 %. One blood pressure the reading of 107/70. I wanted to avoid too low of blood pressure too quickly in this patient. So I cut back medications and then had a restart some medications. Continue follow-up as outpatient.   PHYSICAL EXAMINATION:  GENERAL:  31 y.o.-year-old patient lying in the bed with no acute distress.  EYES: Pupils equal, round, reactive to light and accommodation. No scleral icterus. Extraocular muscles intact.  HEENT: Head atraumatic, normocephalic. Oropharynx and nasopharynx clear.  NECK:  Supple, no jugular venous distention. No thyroid enlargement, no tenderness.  LUNGS: Normal breath sounds bilaterally, no wheezing, rales,rhonchi or crepitation. No use of accessory muscles of respiration.  CARDIOVASCULAR: S1, S2 normal. No murmurs, rubs, or gallops.  ABDOMEN: Soft, non-tender, non-distended. Bowel sounds present. No organomegaly or mass.  EXTREMITIES: Trace edema, no cyanosis, or clubbing.  NEUROLOGIC: Cranial nerves II through XII are intact. Muscle strength 5/5 in all extremities. Sensation intact. Gait not checked.  PSYCHIATRIC: The patient is alert and oriented x 3.  SKIN: No obvious rash, lesion, or ulcer.   DATA REVIEW:   CBC  Recent Labs Lab 03/15/16 0602  WBC 11.0*  HGB 15.6  HCT 44.8  PLT 264    Chemistries   Recent Labs Lab 03/14/16 1501  03/17/16 0316  NA  --   < > 137  K  --   < > 3.5  CL  --   < > 101  CO2  --   < > 28  GLUCOSE  --   < > 148*  BUN  --   < > 21*   CREATININE  --   < > 1.01  CALCIUM  --   < > 9.5  MG 1.9  --   --   < > = values in this interval not displayed.  Cardiac Enzymes  Recent Labs Lab 03/14/16 1851  TROPONINI 0.04*    Management plans discussed with the patient, and he is in agreement.  CODE STATUS:     Code Status Orders        Start     Ordered   03/14/16 1349  Full code  Continuous     03/14/16 1348    Code Status History    Date Active Date Inactive Code Status Order ID Comments User Context   This patient has a current code status but no historical code status.      TOTAL TIME TAKING CARE OF THIS PATIENT: 35 minutes.    Alford HighlandWIETING, Miarose Lippert M.D on 03/17/2016 at 3:53 PM  Between 7am to 6pm -  Pager - [phone]  After 6pm go to [email or web address] - password [email or web address] [facility]  [facility] Office  [phone]  CC: Primary care physician; Open or clinic

## 2016-03-17 NOTE — Progress Notes (Signed)
BP is significantly improved, but not yet normal.  [facility].  Stressed the importance of this followup.  At this time he is very motivated to tackle his illnesses.  Discharged to home.

## 2016-03-19 LAB — ALDOSTERONE + RENIN ACTIVITY W/ RATIO
ALDO / PRA Ratio: 11.2 (ref 0.0–30.0)
Aldosterone: 13.3 ng/dL (ref 0.0–30.0)
PRA LC/MS/MS: 1.191 ng/mL/hr (ref 0.167–5.380)

## 2016-04-10 ENCOUNTER — Emergency Department: Payer: Medicaid Other

## 2016-04-10 ENCOUNTER — Encounter: Payer: Self-pay | Admitting: Emergency Medicine

## 2016-04-10 ENCOUNTER — Emergency Department
Admission: EM | Admit: 2016-04-10 | Discharge: 2016-04-10 | Disposition: A | Discharge status: Home / Self Care | Payer: Medicaid Other | Attending: Emergency Medicine | Admitting: Emergency Medicine

## 2016-04-10 DIAGNOSIS — Z79899 Other long term (current) drug therapy: Secondary | ICD-10-CM | POA: Insufficient documentation

## 2016-04-10 DIAGNOSIS — I10 Essential (primary) hypertension: Secondary | ICD-10-CM | POA: Insufficient documentation

## 2016-04-10 DIAGNOSIS — K5732 Diverticulitis of large intestine without perforation or abscess without bleeding: Secondary | ICD-10-CM

## 2016-04-10 DIAGNOSIS — F17210 Nicotine dependence, cigarettes, uncomplicated: Secondary | ICD-10-CM | POA: Insufficient documentation

## 2016-04-10 LAB — URINALYSIS, COMPLETE (UACMP) WITH MICROSCOPIC
BILIRUBIN URINE: NEGATIVE
Bacteria, UA: NONE SEEN
Glucose, UA: NEGATIVE mg/dL
Ketones, ur: NEGATIVE mg/dL
LEUKOCYTES UA: NEGATIVE
NITRITE: NEGATIVE
PH: 5 (ref 5.0–8.0)
Protein, ur: 100 mg/dL — AB
RBC / HPF: NONE SEEN RBC/hpf (ref 0–5)
SPECIFIC GRAVITY, URINE: 1.023 (ref 1.005–1.030)
Squamous Epithelial / LPF: NONE SEEN

## 2016-04-10 LAB — CBC
HEMATOCRIT: 41 % (ref 40.0–52.0)
Hemoglobin: 14.2 g/dL (ref 13.0–18.0)
MCH: 29.4 pg (ref 26.0–34.0)
MCHC: 34.7 g/dL (ref 32.0–36.0)
MCV: 84.8 fL (ref 80.0–100.0)
PLATELETS: 288 10*3/uL (ref 150–440)
RBC: 4.84 MIL/uL (ref 4.40–5.90)
RDW: 14 % (ref 11.5–14.5)
WBC: 16.1 10*3/uL — AB (ref 3.8–10.6)

## 2016-04-10 LAB — COMPREHENSIVE METABOLIC PANEL
ALT: 31 U/L (ref 17–63)
AST: 23 U/L (ref 15–41)
Albumin: 4.1 g/dL (ref 3.5–5.0)
Alkaline Phosphatase: 77 U/L (ref 38–126)
Anion gap: 7 (ref 5–15)
BILIRUBIN TOTAL: 0.6 mg/dL (ref 0.3–1.2)
BUN: 19 mg/dL (ref 6–20)
CO2: 27 mmol/L (ref 22–32)
CREATININE: 1.09 mg/dL (ref 0.61–1.24)
Calcium: 9.1 mg/dL (ref 8.9–10.3)
Chloride: 103 mmol/L (ref 101–111)
Glucose, Bld: 104 mg/dL — ABNORMAL HIGH (ref 65–99)
POTASSIUM: 3.4 mmol/L — AB (ref 3.5–5.1)
Sodium: 137 mmol/L (ref 135–145)
TOTAL PROTEIN: 7.7 g/dL (ref 6.5–8.1)

## 2016-04-10 LAB — LIPASE, BLOOD: Lipase: 13 U/L (ref 11–51)

## 2016-04-10 MED ORDER — OXYCODONE-ACETAMINOPHEN 5-325 MG PO TABS
2.0000 | ORAL_TABLET | Freq: Once | ORAL | Status: AC
  Administered 2016-04-10: 2 via ORAL
  Filled 2016-04-10: qty 2

## 2016-04-10 MED ORDER — METRONIDAZOLE 500 MG PO TABS
500.0000 mg | ORAL_TABLET | Freq: Once | ORAL | Status: AC
  Administered 2016-04-10: 500 mg via ORAL
  Filled 2016-04-10: qty 1

## 2016-04-10 MED ORDER — CIPROFLOXACIN HCL 500 MG PO TABS: 500.0000 mg | ORAL_TABLET | Freq: Two times a day (BID) | ORAL | 0 refills | Status: AC

## 2016-04-10 MED ORDER — ONDANSETRON HCL 4 MG/2ML IJ SOLN
4.0000 mg | Freq: Once | INTRAMUSCULAR | Status: AC
  Administered 2016-04-10: 4 mg via INTRAVENOUS
  Filled 2016-04-10: qty 2

## 2016-04-10 MED ORDER — OXYCODONE-ACETAMINOPHEN 5-325 MG PO TABS: 1.0000 | ORAL_TABLET | Freq: Four times a day (QID) | ORAL | 0 refills | Status: DC | PRN

## 2016-04-10 MED ORDER — ONDANSETRON 4 MG PO TBDP: 4.0000 mg | ORAL_TABLET | Freq: Three times a day (TID) | ORAL | 0 refills | Status: DC | PRN

## 2016-04-10 MED ORDER — METRONIDAZOLE 500 MG PO TABS: 500.0000 mg | ORAL_TABLET | Freq: Three times a day (TID) | ORAL | 0 refills | Status: AC

## 2016-04-10 MED ORDER — CIPROFLOXACIN HCL 500 MG PO TABS
500.0000 mg | ORAL_TABLET | Freq: Once | ORAL | Status: AC
  Administered 2016-04-10: 500 mg via ORAL
  Filled 2016-04-10: qty 1

## 2016-04-10 MED ORDER — SODIUM CHLORIDE 0.9 % IV BOLUS (SEPSIS)
1000.0000 mL | Freq: Once | INTRAVENOUS | Status: AC
Start: 2016-04-10 — End: 2016-04-10
  Administered 2016-04-10: 1000 mL via INTRAVENOUS

## 2016-04-10 MED ORDER — MORPHINE SULFATE (PF) 4 MG/ML IV SOLN
4.0000 mg | Freq: Once | INTRAVENOUS | Status: AC
  Administered 2016-04-10: 4 mg via INTRAVENOUS
  Filled 2016-04-10: qty 1

## 2016-04-10 NOTE — Discharge Instructions (Signed)
As we discussed please follow-up with a primary care doctor in 1 week for recheck/reevaluation. Please take your antibiotics for the entire course and pain and nausea medication as needed, as written. You may also take Tylenol for discomfort as needed. As we discussed please avoid eating seeds and nuts. Return to the emergency department for any significant fever increased abdominal pain, or any other symptom personally concerning to yourself.

## 2016-04-10 NOTE — ED Provider Notes (Signed)
[facility] Emergency Department Provider Note  Time seen: 3:41 PM  I have reviewed the triage vital signs and the nursing notes.   HISTORY  Chief Complaint Groin Pain    HPI Ricky Farrell is a 31 y.o. male with a pmhx of htn, chest pain, presents to the ER with lower abdominal/groin pain.  According to the pt he got out of the shower last night and sat down on a metal chair.  he states he immediately developed pain in the lower abd which radiates into the scrotum and has become severe lower abd pain.  Denies any pain in the scrotum or testicles but feels like the pain shoots down to this area from the lower abdomen.  Denies dysuria or hematuria.  Denies fever, n/v/d, or penile discharge.  Describes the pain as severe and sharp.  Better when in a fetal position on bed.  Past Medical History:  Diagnosis Date  . Hypertension     Patient Active Problem List   Diagnosis Date Noted  . 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 Chest pain 03/14/2016  . Hypertensive urgency 03/14/2016    History reviewed. No pertinent surgical history.  Prior to Admission medications   Medication Sig Start Date End Date Taking? Authorizing Provider  amLODipine (NORVASC) 5 MG tablet Take 1 tablet (5 mg total) by mouth daily. 03/17/16  Yes Alford Highland, MD  cloNIDine (CATAPRES) 0.2 MG tablet Take 1 tablet (0.2 mg total) by mouth 2 (two) times daily. 03/17/16  Yes Richard Renae Gloss, MD  enalapril (VASOTEC) 10 MG tablet Take 1 tablet (10 mg total) by mouth 2 (two) times daily. 03/17/16  Yes Richard Renae Gloss, MD  glipiZIDE (GLUCOTROL XL) 5 MG 24 hr tablet Take 1 tablet (5 mg total) by mouth daily with breakfast. 03/18/16  Yes Alford Highland, MD  metoprolol (LOPRESSOR) 50 MG tablet Take 1 tablet (50 mg total) by mouth 2 (two) times daily. 03/17/16  Yes Alford Highland, MD  spironolactone (ALDACTONE) 25 MG tablet Take 1 tablet (25 mg total) by mouth daily. Patient not taking: Reported on  04/10/2016 03/18/16   Alford Highland, MD    No Known Allergies  Family History  Problem Relation Age of Onset  . Hypertension Mother   . Diabetes Mother     Social History Social History  Substance Use Topics  . Smoking status: Current Every Day Smoker    Packs/day: 0.10    Types: Cigarettes  . Smokeless tobacco: Never Used  . Alcohol use No    Review of Systems Constitutional: Negative for fever. Cardiovascular: Negative for chest pain. Respiratory: Negative for shortness of breath. Gastrointestinal: Lower abd pain radiating to groin Genitourinary: Negative for dysuria or hematuria Musculoskeletal: Negative for back pain. Skin: Negative for rash. Neurological: Negative for headache 10-point ROS otherwise negative.  ____________________________________________   PHYSICAL EXAM:  VITAL SIGNS: ED Triage Vitals  Enc Vitals Group     BP 04/10/16 1231 (!) 165/82     Pulse Rate 04/10/16 1231 82     Resp 04/10/16 1231 18     Temp 04/10/16 1231 98.1 F (36.7 C)     Temp Source 04/10/16 1231 Oral     SpO2 04/10/16 1231 100 %     Weight 04/10/16 1222 250 lb (113.4 kg)     Height 04/10/16 1222  (1.676 m)     Head Circumference --      Peak Flow --      Pain Score 04/10/16 1222 7  Pain Loc --      Pain Edu? --      Excl. in GC? --     Constitutional: Alert and oriented. Mild distress due to pain Eyes: Normal exam ENT   Head: Normocephalic and atraumatic.   Mouth/Throat: Mucous membranes are moist. Cardiovascular: Normal rate, regular rhythm.  Respiratory: Normal respiratory effort without tachypnea nor retractions. Breath sounds are clear  Gastrointestinal: Mild distention, nontender to palpation, no reb/guarding.  Genitourinary: Normal GU exam, normal penile exam.  Nontender testicles, normal size without hernia mass palpated.  No edema or erythema.  No perineal tenderness. Musculoskeletal: Nontender with normal range of motion in all extremities.   Neurologic:  Normal speech and language. No gross focal neurologic deficits Skin:  Skin is warm, dry and intact.  Psychiatric: Mood and affect are normal.   ____________________________________________     RADIOLOGY  CT negative for stone but positive for diverticulitis of the sigmoid colon without perforation or abscess.  ____________________________________________   INITIAL IMPRESSION / ASSESSMENT AND PLAN / ED COURSE  Pertinent labs & imaging results that were available during my care of the patient were reviewed by me and considered in my medical decision making (see chart for details).  Patient presents to the ER today with lower abd pain radiating to the groin.  Patients exam is largely normal.  Possible slight abdominal distention, but pt states normal BM today.  Non tender exam including abd and gu exam.  Labs show wbc of 16.  Urinalysis shows small hgb on dip but no RBC's on microscopy.  Given the discomfort we will treat pain/nausea and obtain a CT renal scan for further evaluation.  CT scan consistent with sigmoid diverticulitis, uncomplicated. We will treat the patient's pain and nausea, IV hydrate, place the patient on antibiotics and discharge with PCP follow-up. I discussed this with the patient as well as dietary precautions per patient agreeable to plan.  ____________________________________________   FINAL CLINICAL IMPRESSION(S) / ED DIAGNOSES  Abdominal pain Sigmoid diverticulitis   Minna Antis, MD 04/10/16 1635

## 2016-04-10 NOTE — ED Triage Notes (Signed)
Pt to ed with c/o groin pain that radiates into lower abd area since yesterday, pt states it feels better when he is laying flat on the bed.

## 2016-04-10 NOTE — ED Notes (Signed)
Per MD Pad patient ok to get zofran and nausea and wait on Percocet til discharge.

## 2016-04-10 NOTE — ED Notes (Signed)

## 2016-04-10 NOTE — ED Notes (Signed)
Pt reports that he is having a pain under his testicles that radiates into lower abd and lower back - pain started last night - pt denies kidney stone history - pt denies nausea/vomiting

## 2016-04-17 ENCOUNTER — Emergency Department
Admission: EM | Admit: 2016-04-17 | Discharge: 2016-04-17 | Disposition: A | Discharge status: Home / Self Care | Payer: Medicaid Other | Attending: Emergency Medicine | Admitting: Emergency Medicine

## 2016-04-17 ENCOUNTER — Encounter: Payer: Self-pay | Admitting: Emergency Medicine

## 2016-04-17 DIAGNOSIS — E119 Type 2 diabetes mellitus without complications: Secondary | ICD-10-CM | POA: Insufficient documentation

## 2016-04-17 DIAGNOSIS — Z79899 Other long term (current) drug therapy: Secondary | ICD-10-CM | POA: Insufficient documentation

## 2016-04-17 DIAGNOSIS — Z87891 Personal history of nicotine dependence: Secondary | ICD-10-CM | POA: Insufficient documentation

## 2016-04-17 DIAGNOSIS — Z760 Encounter for issue of repeat prescription: Secondary | ICD-10-CM | POA: Insufficient documentation

## 2016-04-17 DIAGNOSIS — I10 Essential (primary) hypertension: Secondary | ICD-10-CM | POA: Insufficient documentation

## 2016-04-17 DIAGNOSIS — Z7984 Long term (current) use of oral hypoglycemic drugs: Secondary | ICD-10-CM | POA: Insufficient documentation

## 2016-04-17 HISTORY — DX: Type 2 diabetes mellitus without complications: E11.9

## 2016-04-17 MED ORDER — AMLODIPINE BESYLATE 5 MG PO TABS: 5.0000 mg | ORAL_TABLET | Freq: Every day | ORAL | 0 refills | Status: DC

## 2016-04-17 MED ORDER — CLONIDINE HCL 0.2 MG PO TABS: 0.2000 mg | ORAL_TABLET | Freq: Two times a day (BID) | ORAL | 0 refills | Status: DC

## 2016-04-17 MED ORDER — METOPROLOL TARTRATE 50 MG PO TABS: 50.0000 mg | ORAL_TABLET | Freq: Two times a day (BID) | ORAL | 0 refills | Status: DC

## 2016-04-17 MED ORDER — GLIPIZIDE ER 5 MG PO TB24: 5.0000 mg | ORAL_TABLET | Freq: Every day | ORAL | 0 refills | Status: DC

## 2016-04-17 MED ORDER — ENALAPRIL MALEATE 10 MG PO TABS: 10.0000 mg | ORAL_TABLET | Freq: Two times a day (BID) | ORAL | 0 refills | Status: DC

## 2016-04-17 NOTE — ED Triage Notes (Signed)
Pt to ED via POV for refill on blood pressure medication. Pt states that he has been out of his medication for the past 2 days. Pt hypertensive in triage, pt denies any symptoms currently. Pt does not appear to be in any distress at this time.

## 2016-04-17 NOTE — Discharge Instructions (Signed)
Please make sure you take all of your medications as prescribed and keep your follow-up in clinic on Tuesday as scheduled. Return to the ER for any concerns.  It was a pleasure to take care of you today, and thank you for coming to our emergency department.  If you have any questions or concerns before leaving please ask the nurse to grab me and I'm more than happy to go through your aftercare instructions again.  If you were prescribed any opioid pain medication today such as Norco, Vicodin, Percocet, morphine, hydrocodone, or oxycodone please make sure you do not drive when you are taking this medication as it can alter your ability to drive safely.  If you have any concerns once you are home that you are not improving or are in fact getting worse before you can make it to your follow-up appointment, please do not hesitate to call 911 and come back for further evaluation.  Merrily Brittle MD

## 2016-04-17 NOTE — ED Provider Notes (Signed)
[facility] Emergency Department Provider Note  ____________________________________________   First MD Initiated Contact with Patient 04/17/16 1049     (approximate)  I have reviewed the triage vital signs and the nursing notes.   HISTORY  Chief Complaint Hypertension    HPI Ricky Farrell is a 31 y.o. male who comes to the ED requesting a refill of his antihypertensive medications.  He has no complaints, but says he called 911 because he didn't have a ride.  He ran out of his meds 2 days ago and can't see his doctor until this coming Tuesday.  He has no acute complaints.   Past Medical History:  Diagnosis Date  . Diabetes mellitus without complication (HCC)   . Hypertension     Patient Active Problem List   Diagnosis Date Noted  . 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 Chest pain 03/14/2016  . Hypertensive urgency 03/14/2016    History reviewed. No pertinent surgical history.  Prior to Admission medications   Medication Sig Start Date End Date Taking? Authorizing Provider  amLODipine (NORVASC) 5 MG tablet Take 1 tablet (5 mg total) by mouth daily. 04/17/16   Merrily Brittle, MD  ciprofloxacin (CIPRO) 500 MG tablet Take 1 tablet (500 mg total) by mouth 2 (two) times daily. 04/10/16 04/24/16  Minna Antis, MD  cloNIDine (CATAPRES) 0.2 MG tablet Take 1 tablet (0.2 mg total) by mouth 2 (two) times daily. 04/17/16   Merrily Brittle, MD  enalapril (VASOTEC) 10 MG tablet Take 1 tablet (10 mg total) by mouth 2 (two) times daily. 04/17/16   Merrily Brittle, MD  glipiZIDE (GLUCOTROL XL) 5 MG 24 hr tablet Take 1 tablet (5 mg total) by mouth daily with breakfast. 04/17/16   Merrily Brittle, MD  metoprolol (LOPRESSOR) 50 MG tablet Take 1 tablet (50 mg total) by mouth 2 (two) times daily. 04/17/16   Merrily Brittle, MD  metroNIDAZOLE (FLAGYL) 500 MG tablet Take 1 tablet (500 mg total) by mouth 3 (three) times daily. 04/10/16 04/24/16  Minna Antis, MD    ondansetron (ZOFRAN ODT) 4 MG disintegrating tablet Take 1 tablet (4 mg total) by mouth every 8 (eight) hours as needed for nausea or vomiting. 04/10/16   Minna Antis, MD  oxyCODONE-acetaminophen (ROXICET) 5-325 MG tablet Take 1 tablet by mouth every 6 (six) hours as needed. 04/10/16   Minna Antis, MD  spironolactone (ALDACTONE) 25 MG tablet Take 1 tablet (25 mg total) by mouth daily. Patient not taking: Reported on 04/10/2016 03/18/16   Alford Highland, MD    Allergies Patient has no known allergies.  Family History  Problem Relation Age of Onset  . Hypertension Mother   . Diabetes Mother     Social History Social History  Substance Use Topics  . Smoking status: Former Smoker    Packs/day: 0.10    Types: Cigarettes  . Smokeless tobacco: Never Used  . Alcohol use No    Review of Systems Constitutional: No fever/chills Eyes: No visual changes. ENT: No sore throat. Cardiovascular: Denies chest pain. Respiratory: Denies shortness of breath. Gastrointestinal: No abdominal pain.  No nausea, no vomiting.  No diarrhea.  No constipation. Genitourinary: Negative for dysuria. Musculoskeletal: Negative for back pain. Skin: Negative for rash. Neurological: Negative for headaches, focal weakness or numbness.  10-point ROS otherwise negative.  ____________________________________________   PHYSICAL EXAM:  VITAL SIGNS: ED Triage Vitals [04/17/16 1003]  Enc Vitals Group     BP (!) 220/124     Pulse Rate 86  Resp 15     Temp 98.1 F (36.7 C)     Temp Source Oral     SpO2 98 %     Weight 266 lb (120.7 kg)     Height  (1.702 m)     Head Circumference      Peak Flow      Pain Score      Pain Loc      Pain Edu?      Excl. in GC?     Constitutional: Alert and oriented x 4 well appearing nontoxic no diaphoresis speaks in full, clear sentences Eyes: PERRL EOMI. Head: Atraumatic. Nose: No congestion/rhinnorhea. Mouth/Throat: No trismus Neck: No stridor.    Cardiovascular: Normal rate, regular rhythm. Grossly normal heart sounds.  Good peripheral circulation. Respiratory: Normal respiratory effort.  No retractions. Lungs CTAB and moving good air Gastrointestinal: Soft nondistended nontender  Musculoskeletal: No lower extremity edema   Neurologic:  Normal speech and language. No gross focal neurologic deficits are appreciated. Skin:  Skin is warm, dry and intact. No rash noted. Psychiatric: Mood and affect are normal. Speech and behavior are normal.     LABS (all labs ordered are listed, but only abnormal results are displayed)  Labs Reviewed - No data to display   __________________________________________  EKG   ____________________________________________  RADIOLOGY   ____________________________________________   PROCEDURES  Procedure(s) performed: no  Procedures  Critical Care performed: no  ____________________________________________   INITIAL IMPRESSION / ASSESSMENT AND PLAN / ED COURSE  Pertinent labs & imaging results that were available during my care of the patient were reviewed by me and considered in my medical decision making (see chart for details).  The patient arrives with no complaints aside from being out of his medications. He missed his appointment last Tuesday to see his primary care doctor and cannot go to clinic again until Tuesday. He is out of refills. He called 911 today not because he had any acute medical issues but only because he had no ride to get to the hospital. We will refill his medication and discharge him with follow-up as are the schedule.      ____________________________________________   FINAL CLINICAL IMPRESSION(S) / ED DIAGNOSES  Final diagnoses:  Hypertension, unspecified type  Medication refill      NEW MEDICATIONS STARTED DURING THIS VISIT:  Discharge Medication List as of 04/17/2016 10:57 AM       Note:  This document was prepared using Dragon voice  recognition software and may include unintentional dictation errors.     Merrily Brittle, MD 04/18/16 [phone]

## 2016-05-21 ENCOUNTER — Ambulatory Visit: Payer: Self-pay | Admitting: Internal Medicine

## 2016-05-21 VITALS — BP 192/133 | HR 102 | Temp 97.9°F | Wt 261.2 lb

## 2016-05-21 DIAGNOSIS — E119 Type 2 diabetes mellitus without complications: Secondary | ICD-10-CM

## 2016-05-21 DIAGNOSIS — I10 Essential (primary) hypertension: Secondary | ICD-10-CM

## 2016-05-21 LAB — GLUCOSE, POCT (MANUAL RESULT ENTRY): POC Glucose: 99 mg/dl (ref 70–99)

## 2016-05-21 MED ORDER — CLONIDINE HCL 0.2 MG PO TABS: 0.2000 mg | ORAL_TABLET | Freq: Two times a day (BID) | ORAL | 0 refills | Status: DC

## 2016-05-21 MED ORDER — ENALAPRIL MALEATE 10 MG PO TABS: 10.0000 mg | ORAL_TABLET | Freq: Two times a day (BID) | ORAL | 0 refills | Status: DC

## 2016-05-21 MED ORDER — METOPROLOL TARTRATE 50 MG PO TABS: 50.0000 mg | ORAL_TABLET | Freq: Two times a day (BID) | ORAL | 0 refills | Status: DC

## 2016-05-21 MED ORDER — SPIRONOLACTONE 25 MG PO TABS: 25.0000 mg | ORAL_TABLET | Freq: Every day | ORAL | 0 refills | Status: DC

## 2016-05-21 MED ORDER — GLIPIZIDE ER 5 MG PO TB24: 5.0000 mg | ORAL_TABLET | Freq: Every day | ORAL | 0 refills | Status: DC

## 2016-05-21 MED ORDER — AMLODIPINE BESYLATE 5 MG PO TABS: 5.0000 mg | ORAL_TABLET | Freq: Every day | ORAL | 0 refills | Status: DC

## 2016-05-21 NOTE — Patient Instructions (Signed)
Pick up medicines first thing tomorrow morning.  Go to ED tonight if chest pain, trouble breathing, or increasing headache. Followup appointment in 1 month with labs first:  CMP, HbA1c, lipid panel, TSH.

## 2016-05-22 ENCOUNTER — Ambulatory Visit: Payer: Medicaid Other | Admitting: Pharmacy Technician

## 2016-05-22 DIAGNOSIS — Z79899 Other long term (current) drug therapy: Secondary | ICD-10-CM

## 2016-05-25 NOTE — Progress Notes (Signed)
Met with patient.  Offered to complete financial assistance application for  due to recent hospital visit.  Patient agreed to be responsible for the completion of the applications, and for gathering financial information and forwarding to appropriate department in [facility].    Completed [facility] application and contract.  Patient agreed to all terms of the [facility] contract.  Patient to provide 30 days of bank statements.   Patient approved to receive medication assistance through 2018 at [facility], as long as continued eligibility criteria continues to be met.  Provided patient with Civil engineer, contracting based on his particular needs.    Referred patient to [facility] and for an MTM.  [place] [facility]

## 2016-05-25 NOTE — Progress Notes (Signed)
  Patient: Ricky Farrell Male    DOB: 1985-05-24   31 y.o.   MRN: [id number] Visit Date: 05/25/2016  Today's Provider: Eustace MooreMURRAY, Miyako Oelke W, MD   Chief Complaint  Patient presents with  . Medication Refill   Subjective:    HPI  Out of blood pressure medicine.  Currently no chest pain, not short of breath.  No leg swelling.  Walked into urgent care independently.       No Known Allergies Previous Medications   ONDANSETRON (ZOFRAN ODT) 4 MG DISINTEGRATING TABLET    Take 1 tablet (4 mg total) by mouth every 8 (eight) hours as needed for nausea or vomiting.   OXYCODONE-ACETAMINOPHEN (ROXICET) 5-325 MG TABLET    Take 1 tablet by mouth every 6 (six) hours as needed.    Review of Systems  All other systems reviewed and are negative.   Social History  Substance Use Topics  . Smoking status: Former Smoker    Packs/day: 0.10    Types: Cigarettes  . Smokeless tobacco: Never Used  . Alcohol use No   Objective:   BP (!) 192/133   Pulse (!) 102   Temp 97.9 F (36.6 C)   Wt 261 lb 3.2 oz (118.5 kg)   BMI 40.91 kg/m   Physical Exam  Constitutional: He is oriented to person, place, and time. No distress.  Alert, nicely groomed  HENT:  Head: Atraumatic.  Eyes:  Conjugate gaze, no eye redness/drainage  Neck: Neck supple.  Cardiovascular: Normal rate and regular rhythm.   Pulmonary/Chest: No respiratory distress. He has no wheezes. He has no rales.  Lungs clear, symmetric breath sounds  Abdominal: He exhibits no distension.  Musculoskeletal: Normal range of motion.  Neurological: He is alert and oriented to person, place, and time.  Speech clear/coherent Face symmetricj Gait steady  Skin: Skin is warm and dry.  No cyanosis  Nursing note and vitals reviewed.       Assessment & Plan:      DM HTN  Pick up medicines first thing tomorrow morning.  Go to ED tonight if chest pain, trouble breathing, or increasing headache. Followup appointment in 1 month with labs first:  CMP,  HbA1c, lipid panel, TSH.     Dayton ScrapeMURRAY, Mayra ReelLAURA W, MD   [facility]

## 2016-06-07 ENCOUNTER — Telehealth (HOSPITAL_COMMUNITY): Payer: Self-pay | Admitting: Internal Medicine

## 2016-06-07 MED ORDER — GLIPIZIDE 5 MG PO TABS: 5.0000 mg | ORAL_TABLET | Freq: Two times a day (BID) | ORAL | 1 refills | Status: AC

## 2016-06-07 NOTE — Telephone Encounter (Signed)
  Note received from pharmacist requesting change in glipizide due to formulary:  'Rcv rx for glipizide ER 5mg  on 05/21/16.   We do not stock the extended release format of glipizide. Requesting provided please re-send rx glipizide 5mg  qam or bid. '  Will send rx for glipizide 5mg  bid; HbA1c was 9.1 in 03/2016.  LM

## 2016-06-09 ENCOUNTER — Other Ambulatory Visit: Payer: Self-pay | Admitting: Adult Health Nurse Practitioner

## 2016-06-11 ENCOUNTER — Ambulatory Visit: Payer: Medicaid Other | Admitting: Pharmacist

## 2016-06-11 ENCOUNTER — Other Ambulatory Visit: Payer: Self-pay

## 2016-06-11 VITALS — BP 180/90 | Wt 266.0 lb

## 2016-06-11 DIAGNOSIS — Z0000 Encounter for general adult medical examination without abnormal findings: Secondary | ICD-10-CM

## 2016-06-11 DIAGNOSIS — Z79899 Other long term (current) drug therapy: Secondary | ICD-10-CM

## 2016-06-11 NOTE — Progress Notes (Signed)
[facility] Visit Note  Patient: Ricky Farrell MRN: [id number] Date of Birth: 1985-12-16 PCP: Ricky Dauer, FNP   Ricky Farrell 31 y.o. male presents for an initial MTM visit today. Main objective of today's visit is to reconcile medications and encourage/counsel patient about HTN and DM. Pt is motivated to improve health to get off medication.  BP (!) 180/90   Wt 266 lb (120.7 kg)   BMI 41.66 kg/m   Patient Information   Past Medical History:  Diagnosis Date  . Diabetes mellitus without complication (HCC)   . Diverticulitis   . Hypertension      No past surgical history on file.   Family History  Problem Relation Age of Onset  . Hypertension Mother   . Diabetes Mother     New Diagnoses (since last visit):   Family Support: has 4 girls; girlfriend and mom are good support  Lifestyle Diet: Breakfast: cereal, cream of wheat Lunch: tomato sandwich Dinner: baked chicken, salmon Drinks: water, kool-aid            History  Alcohol Use No      History  Smoking Status  . Former Smoker  . Packs/day: 0.10  . Types: Cigarettes  Smokeless Tobacco  . Never Used      Health Maintenance  Topic Date Due  . PNEUMOCOCCAL POLYSACCHARIDE VACCINE (1) 10/22/1985  . FOOT EXAM  10/22/1993  . OPHTHALMOLOGY EXAM  10/22/1993  . HIV Screening  10/23/1998  . TETANUS/TDAP  10/23/2002  . INFLUENZA VACCINE  08/12/2016  . HEMOGLOBIN A1C  09/14/2016     Outpatient Encounter Prescriptions as of 06/11/2016  Medication Sig  . amLODipine (NORVASC) 5 MG tablet Take 1 tablet (5 mg total) by mouth daily.  . cloNIDine (CATAPRES) 0.2 MG tablet Take 1 tablet (0.2 mg total) by mouth 2 (two) times daily.  . enalapril (VASOTEC) 10 MG tablet Take 1 tablet (10 mg total) by mouth 2 (two) times daily.  Marland Kitchen glipiZIDE (GLUCOTROL) 5 MG tablet Take 1 tablet (5 mg total) by mouth 2 (two) times daily before a meal.  . metoprolol (LOPRESSOR) 50 MG tablet Take 1 tablet (50 mg  total) by mouth 2 (two) times daily.  Marland Kitchen spironolactone (ALDACTONE) 25 MG tablet Take 1 tablet (25 mg total) by mouth daily.  . ondansetron (ZOFRAN ODT) 4 MG disintegrating tablet Take 1 tablet (4 mg total) by mouth every 8 (eight) hours as needed for nausea or vomiting. (Patient not taking: Reported on 05/21/2016)  . oxyCODONE-acetaminophen (ROXICET) 5-325 MG tablet Take 1 tablet by mouth every 6 (six) hours as needed. (Patient not taking: Reported on 05/21/2016)   No facility-administered encounter medications on file as of 06/11/2016.      Assessment and Plan:  Compliance/Adherance:  When asked about 3 W's pt knew all for each mediation without any prompting. He states he does not miss doses.   HTN: BP today of 180/90. Discussed goal BP of 140/90;   DIABETES: last A1c of 9.1 in 03/2016. Talked with pt about what A1C measures and stated goal of 6.0. Pt states that he checks BGs 3x/day and range 100-190. States that the highest has been 190 but that was after eating cake. He states that he has had only 2 low's one time at 60 and his hands shook. He stated that he ate some grape jelly and this brought his sugar back up. Counseled pt that orange juice was preferable. We discussed checking BG 3x/day and the importance of keeping  a log. Talked about the importance of diet and exercise. Gave pt information booklets and BG log. We also discussed importance of taking glipizide with breakfast/eating with this medication to avoid low's.  Diverticulitis: controlled with diet  Ricky Farrell, PharmD, RPh [facility] [phone]

## 2016-06-12 LAB — TSH: TSH: 2.06 u[IU]/mL (ref 0.450–4.500)

## 2016-06-12 LAB — COMPREHENSIVE METABOLIC PANEL
A/G RATIO: 1.5 (ref 1.2–2.2)
ALBUMIN: 4.5 g/dL (ref 3.5–5.5)
ALK PHOS: 90 IU/L (ref 39–117)
ALT: 33 IU/L (ref 0–44)
AST: 25 IU/L (ref 0–40)
BUN / CREAT RATIO: 19 (ref 9–20)
BUN: 19 mg/dL (ref 6–20)
CHLORIDE: 101 mmol/L (ref 96–106)
CO2: 25 mmol/L (ref 18–29)
Calcium: 9.6 mg/dL (ref 8.7–10.2)
Creatinine, Ser: 0.99 mg/dL (ref 0.76–1.27)
GFR calc Af Amer: 116 mL/min/{1.73_m2} (ref >59)
GFR calc non Af Amer: 100 mL/min/{1.73_m2} (ref >59)
GLOBULIN, TOTAL: 3 g/dL (ref 1.5–4.5)
Glucose: 90 mg/dL (ref 65–99)
POTASSIUM: 3.7 mmol/L (ref 3.5–5.2)
SODIUM: 140 mmol/L (ref 134–144)
Total Protein: 7.5 g/dL (ref 6.0–8.5)

## 2016-06-12 LAB — HEMOGLOBIN A1C
ESTIMATED AVERAGE GLUCOSE: 157 mg/dL
HEMOGLOBIN A1C: 7.1 % — AB (ref 4.8–5.6)

## 2016-06-12 LAB — LIPID PANEL
CHOL/HDL RATIO: 4.3 ratio (ref 0.0–5.0)
Cholesterol, Total: 177 mg/dL (ref 100–199)
HDL: 41 mg/dL (ref >39)
LDL Calculated: 104 mg/dL — ABNORMAL HIGH (ref 0–99)
Triglycerides: 159 mg/dL — ABNORMAL HIGH (ref 0–149)
VLDL Cholesterol Cal: 32 mg/dL (ref 5–40)

## 2016-06-18 ENCOUNTER — Other Ambulatory Visit: Payer: Self-pay

## 2016-06-18 ENCOUNTER — Other Ambulatory Visit: Payer: Self-pay | Admitting: Family Medicine

## 2016-06-18 DIAGNOSIS — I10 Essential (primary) hypertension: Secondary | ICD-10-CM

## 2016-06-18 MED ORDER — AMLODIPINE BESYLATE 10 MG PO TABS: 10.0000 mg | ORAL_TABLET | Freq: Every day | ORAL | 4 refills | Status: AC

## 2016-06-18 MED ORDER — SPIRONOLACTONE 25 MG PO TABS: 25.0000 mg | ORAL_TABLET | Freq: Two times a day (BID) | ORAL | 0 refills | Status: DC

## 2016-06-22 ENCOUNTER — Other Ambulatory Visit: Payer: Self-pay | Admitting: Internal Medicine

## 2016-06-22 DIAGNOSIS — I10 Essential (primary) hypertension: Secondary | ICD-10-CM

## 2016-07-02 ENCOUNTER — Ambulatory Visit: Payer: Self-pay | Admitting: Family Medicine

## 2016-07-02 VITALS — BP 150/100 | HR 76 | Temp 98.2°F | Wt 272.4 lb

## 2016-07-02 DIAGNOSIS — E119 Type 2 diabetes mellitus without complications: Secondary | ICD-10-CM

## 2016-07-02 DIAGNOSIS — I10 Essential (primary) hypertension: Secondary | ICD-10-CM

## 2016-07-02 LAB — POCT CBG (FASTING - GLUCOSE)-MANUAL ENTRY: GLUCOSE FASTING, POC: 131 mg/dL — AB (ref 70–99)

## 2016-07-02 MED ORDER — ENALAPRIL MALEATE 20 MG PO TABS: 20.0000 mg | ORAL_TABLET | Freq: Two times a day (BID) | ORAL | 3 refills | Status: AC

## 2016-07-02 NOTE — Assessment & Plan Note (Signed)
Not under good control. Will increase his enlalapril to 20mg  BID-- monitor potassium closely on spironalactone. Will check baseline BMP ASAP and repeat prior to follow up in 2 weeks. Continue all other BP medicines.

## 2016-07-02 NOTE — Progress Notes (Signed)
BP (!) 150/100   Pulse 76   Temp 98.2 F (36.8 C)   Wt 272 lb 6.4 oz (123.6 kg)   BMI 42.66 kg/m    Subjective:    Patient ID: Ricky Farrell Timmons, male    DOB: Jan 13, 1986, 31 y.o.   MRN: [id number]  HPI: Ricky Farrell Privott is a 31 y.o. male  Chief Complaint  Patient presents with  . Follow-up   HYPERTENSION Hypertension status: uncontrolled  Satisfied with current treatment? no Duration of hypertension: chronic BP monitoring frequency:  not checking BP medication side effects:  no Medication compliance: excellent compliance Previous BP meds: amlodipine, enlapril, spironalatone, clonidine Aspirin: no Recurrent headaches: no Visual changes: no Palpitations: no Dyspnea: no Chest pain: no Lower extremity edema: no Dizzy/lightheaded: no  Relevant past medical, surgical, family and social history reviewed and updated as indicated. Interim medical history since our last visit reviewed. Allergies and medications reviewed and updated.  Review of Systems  Constitutional: Negative.   Respiratory: Negative.   Cardiovascular: Negative.   Psychiatric/Behavioral: Negative.     Per HPI unless specifically indicated above     Objective:    BP (!) 150/100   Pulse 76   Temp 98.2 F (36.8 C)   Wt 272 lb 6.4 oz (123.6 kg)   BMI 42.66 kg/m   Wt Readings from Last 3 Encounters:  07/02/16 272 lb 6.4 oz (123.6 kg)  06/11/16 266 lb (120.7 kg)  05/21/16 261 lb 3.2 oz (118.5 kg)    Physical Exam  Constitutional: He is oriented to person, place, and time. He appears well-developed and well-nourished. No distress.  HENT:  Head: Normocephalic and atraumatic.  Right Ear: Hearing normal.  Left Ear: Hearing normal.  Nose: Nose normal.  Eyes: Conjunctivae and lids are normal. Right eye exhibits no discharge. Left eye exhibits no discharge. No scleral icterus.  Cardiovascular: Normal rate, regular rhythm, normal heart sounds and intact distal pulses.  Exam reveals no gallop and no  friction rub.   No murmur heard. Pulmonary/Chest: Effort normal and breath sounds normal. No respiratory distress. He has no wheezes. He has no rales. He exhibits no tenderness.  Musculoskeletal: Normal range of motion.  Neurological: He is alert and oriented to person, place, and time.  Skin: Skin is warm, dry and intact. No rash noted. He is not diaphoretic. No erythema. No pallor.  Psychiatric: He has a normal mood and affect. His speech is normal and behavior is normal. Judgment and thought content normal. Cognition and memory are normal.  Nursing note and vitals reviewed.   Results for orders placed or performed in visit on 07/02/16  POCT CBG (Fasting - Glucose)  Result Value Ref Range   Glucose Fasting, POC 131 (A) 70 - 99 mg/dL      Assessment & Plan:   Problem List Items Addressed This Visit      Cardiovascular and Mediastinum   Hypertension - Primary    Not under good control. Will increase his enlalapril to 20mg  BID-- monitor potassium closely on spironalactone. Will check baseline BMP ASAP and repeat prior to follow up in 2 weeks. Continue all other BP medicines.       Relevant Medications   enalapril (VASOTEC) 20 MG tablet   Other Relevant Orders   Basic metabolic panel   Basic metabolic panel    Other Visit Diagnoses    Diabetes mellitus without complication (HCC)       Relevant Medications   enalapril (VASOTEC) 20 MG tablet   Other Relevant  Orders   POCT CBG (Fasting - Glucose) (Completed)       Follow up plan: Return for ASAP BMP, 2 weeks for follow up BP with BMP prior.

## 2016-07-07 ENCOUNTER — Other Ambulatory Visit: Payer: Self-pay

## 2016-07-14 ENCOUNTER — Other Ambulatory Visit: Payer: Self-pay

## 2016-07-14 ENCOUNTER — Ambulatory Visit: Payer: Self-pay | Admitting: Adult Health Nurse Practitioner

## 2016-07-14 VITALS — BP 158/99 | HR 76 | Temp 98.3°F | Ht 66.0 in | Wt 269.1 lb

## 2016-07-14 DIAGNOSIS — I10 Essential (primary) hypertension: Secondary | ICD-10-CM

## 2016-07-14 MED ORDER — CLONIDINE HCL 0.3 MG PO TABS: 0.3000 mg | ORAL_TABLET | Freq: Two times a day (BID) | ORAL | 3 refills | Status: AC

## 2016-07-14 NOTE — Progress Notes (Signed)
  Patient: Deborha PaymentMichael Kocak Male    DOB: 10/08/1985   31 y.o.   MRN: [id number] Visit Date: 07/14/2016  Today's Provider: Jacelyn Pieah Doles-Johnson, NP   No chief complaint on file.  Subjective:    HPI  Last visit BP 150/100.  Last visit enalapril was increased to 20mg  BID.      No Known Allergies Previous Medications   AMLODIPINE (NORVASC) 10 MG TABLET    Take 1 tablet (10 mg total) by mouth daily.   CLONIDINE (CATAPRES) 0.2 MG TABLET    TAKE ONE TABLET BY MOUTH 2 TIMES A DAY   ENALAPRIL (VASOTEC) 20 MG TABLET    Take 1 tablet (20 mg total) by mouth 2 (two) times daily.   GLIPIZIDE (GLUCOTROL) 5 MG TABLET    Take 1 tablet (5 mg total) by mouth 2 (two) times daily before a meal.   METOPROLOL TARTRATE (LOPRESSOR) 50 MG TABLET    TAKE ONE TABLET BY MOUTH 2 TIMES A DAY   SPIRONOLACTONE (ALDACTONE) 25 MG TABLET    Take 1 tablet (25 mg total) by mouth 2 (two) times daily.    Review of Systems  All other systems reviewed and are negative.   Social History  Substance Use Topics  . Smoking status: Former Smoker    Packs/day: 0.10    Types: Cigarettes  . Smokeless tobacco: Never Used  . Alcohol use No   Objective:   BP (!) 158/99   Pulse 76   Temp 98.3 F (36.8 C)   Ht 5\' 6"  (1.676 m)   Wt 269 lb 1.6 oz (122.1 kg)   BMI 43.43 kg/m   Physical Exam  Constitutional: He appears well-developed and well-nourished.  Cardiovascular: Normal rate, regular rhythm and normal heart sounds.   Pulmonary/Chest: Effort normal and breath sounds normal.  Vitals reviewed.       Assessment & Plan:         HTN:  Not controlled.  Goal BP <140/80.  Continue current medication regimen and increase clonidine 0.3mg   BID.  Encourage low salt diet and exercise.   FU in 3 weeks for BP check.    Jacelyn Pieah Doles-Johnson, NP   [facility]

## 2016-07-21 ENCOUNTER — Other Ambulatory Visit: Payer: Self-pay | Admitting: Internal Medicine

## 2016-07-21 DIAGNOSIS — I10 Essential (primary) hypertension: Secondary | ICD-10-CM

## 2016-08-06 ENCOUNTER — Ambulatory Visit: Payer: Self-pay

## 2016-08-06 VITALS — BP 162/105

## 2016-08-06 DIAGNOSIS — I270 Primary pulmonary hypertension: Secondary | ICD-10-CM

## 2016-08-06 MED ORDER — SPIRONOLACTONE 50 MG PO TABS: 50.0000 mg | ORAL_TABLET | Freq: Two times a day (BID) | ORAL | 5 refills | Status: AC

## 2016-08-19 ENCOUNTER — Other Ambulatory Visit: Payer: Self-pay | Admitting: Internal Medicine

## 2016-08-19 DIAGNOSIS — I10 Essential (primary) hypertension: Secondary | ICD-10-CM

## 2016-09-17 ENCOUNTER — Encounter: Payer: Medicaid Other | Admitting: Pharmacist

## 2016-11-27 ENCOUNTER — Encounter: Payer: Self-pay | Admitting: Emergency Medicine

## 2016-11-27 ENCOUNTER — Other Ambulatory Visit: Payer: Self-pay

## 2016-11-27 ENCOUNTER — Emergency Department
Admission: EM | Admit: 2016-11-27 | Discharge: 2016-11-27 | Disposition: A | Discharge status: Home / Self Care | Payer: Self-pay | Attending: Emergency Medicine | Admitting: Emergency Medicine

## 2016-11-27 DIAGNOSIS — Z79899 Other long term (current) drug therapy: Secondary | ICD-10-CM | POA: Insufficient documentation

## 2016-11-27 DIAGNOSIS — K0889 Other specified disorders of teeth and supporting structures: Secondary | ICD-10-CM | POA: Insufficient documentation

## 2016-11-27 DIAGNOSIS — F17210 Nicotine dependence, cigarettes, uncomplicated: Secondary | ICD-10-CM | POA: Insufficient documentation

## 2016-11-27 DIAGNOSIS — Z7984 Long term (current) use of oral hypoglycemic drugs: Secondary | ICD-10-CM | POA: Insufficient documentation

## 2016-11-27 DIAGNOSIS — I10 Essential (primary) hypertension: Secondary | ICD-10-CM | POA: Insufficient documentation

## 2016-11-27 DIAGNOSIS — E119 Type 2 diabetes mellitus without complications: Secondary | ICD-10-CM | POA: Insufficient documentation

## 2016-11-27 MED ORDER — LIDOCAINE VISCOUS 2 % MT SOLN
15.0000 mL | Freq: Once | OROMUCOSAL | Status: AC
  Administered 2016-11-27: 15 mL via OROMUCOSAL
  Filled 2016-11-27: qty 15

## 2016-11-27 MED ORDER — KETOROLAC TROMETHAMINE 10 MG PO TABS: 10.0000 mg | ORAL_TABLET | Freq: Four times a day (QID) | ORAL | 0 refills | Status: AC | PRN

## 2016-11-27 MED ORDER — AMOXICILLIN 500 MG PO TABS: 500.0000 mg | ORAL_TABLET | Freq: Three times a day (TID) | ORAL | 0 refills | Status: AC

## 2016-11-27 MED ORDER — KETOROLAC TROMETHAMINE 30 MG/ML IJ SOLN
30.0000 mg | Freq: Once | INTRAMUSCULAR | Status: AC
  Administered 2016-11-27: 30 mg via INTRAMUSCULAR
  Filled 2016-11-27: qty 1

## 2016-11-27 NOTE — ED Notes (Signed)
Pt given instructions to follow up with dentist asap. [facility]

## 2016-11-27 NOTE — ED Provider Notes (Signed)
[facility] Emergency Department Provider Note  ____________________________________________  Time seen: Approximately 4:18 PM  I have reviewed the triage vital signs and the nursing notes.   HISTORY  Chief Complaint Dental Pain    HPI Ricky Farrell is a 31 y.o. male presents to the emergency department with superior 1 pain.  Patient reports that superior one became broken several months ago.  Patient reports that he has dental insurance but has not currently made an appointment with a dentist.  He has not noticed edema of the right upper jaw.  He denies fever and chills.  No alleviating measures have been attempted.   Past Medical History:  Diagnosis Date  . Diabetes mellitus without complication (HCC)   . Diverticulitis   . Hypertension     Patient Active Problem List   Diagnosis Date Noted  . Hypertension 07/02/2016  . 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 Chest pain 03/14/2016  . Hypertensive urgency 03/14/2016    History reviewed. No pertinent surgical history.  Prior to Admission medications   Medication Sig Start Date End Date Taking? Authorizing Provider  amLODipine (NORVASC) 10 MG tablet Take 1 tablet (10 mg total) by mouth daily. 06/18/16   Steele Sizer, MD  amoxicillin (AMOXIL) 500 MG tablet Take 1 tablet (500 mg total) 3 (three) times daily for 10 days by mouth. 11/27/16 12/07/16  Orvil Feil, PA-C  cloNIDine (CATAPRES) 0.2 MG tablet TAKE ONE TABLET BY MOUTH 2 TIMES A DAY 08/19/16   Virl Axe, MD  cloNIDine (CATAPRES) 0.3 MG tablet Take 1 tablet (0.3 mg total) by mouth 2 (two) times daily. 07/14/16   Doles-Johnson, Teah, NP  enalapril (VASOTEC) 20 MG tablet Take 1 tablet (20 mg total) by mouth 2 (two) times daily. 07/02/16   Johnson, Megan P, DO  glipiZIDE (GLUCOTROL) 5 MG tablet Take 1 tablet (5 mg total) by mouth 2 (two) times daily before a meal. 06/07/16 09/05/16  Eustace Moore, MD  ketorolac (TORADOL) 10 MG tablet  Take 1 tablet (10 mg total) every 6 (six) hours as needed for up to 5 days by mouth. 11/27/16 12/02/16  Orvil Feil, PA-C  metoprolol tartrate (LOPRESSOR) 50 MG tablet TAKE ONE TABLET BY MOUTH 2 TIMES A DAY 08/19/16   Virl Axe, MD  spironolactone (ALDACTONE) 50 MG tablet Take 1 tablet (50 mg total) by mouth 2 (two) times daily. 08/06/16   Maple Hudson., MD    Allergies Patient has no known allergies.  Family History  Problem Relation Age of Onset  . Hypertension Mother   . Diabetes Mother     Social History Social History   Tobacco Use  . Smoking status: Current Every Day Smoker    Packs/day: 0.10    Types: Cigarettes  . Smokeless tobacco: Never Used  Substance Use Topics  . Alcohol use: No  . Drug use: No     Review of Systems  Constitutional: No fever/chills Eyes: No visual changes. No discharge ENT: Patient has dental pain.  Cardiovascular: no chest pain. Respiratory: no cough. No SOB. Musculoskeletal: Negative for musculoskeletal pain. Skin: Negative for rash, abrasions, lacerations, ecchymosis. Neurological: Negative for headaches, focal weakness or numbness.   ____________________________________________   PHYSICAL EXAM:  VITAL SIGNS: ED Triage Vitals  Enc Vitals Group     BP 11/27/16 1550 (!) 179/93     Pulse Rate 11/27/16 1550 94     Resp 11/27/16 1550 18     Temp 11/27/16 1550  98.6 F (37 C)     Temp Source 11/27/16 1550 Oral     SpO2 11/27/16 1550 97 %     Weight 11/27/16 1551 262 lb (118.8 kg)     Height 11/27/16 1551 5\' 6"  (1.676 m)     Head Circumference --      Peak Flow --      Pain Score 11/27/16 1555 7     Pain Loc --      Pain Edu? --      Excl. in GC? --      Constitutional: Alert and oriented. Well appearing and in no acute distress. Eyes: Conjunctivae are normal. PERRL. EOMI. Head: Atraumatic. ENT:      Ears: TMs are pearly bilaterally.      Nose: No congestion/rhinnorhea.      Mouth/Throat: Mucous membranes  are moist.  Patient has broken superior 1. Cardiovascular: Normal rate, regular rhythm. Normal S1 and S2.  Good peripheral circulation. Respiratory: Normal respiratory effort without tachypnea or retractions. Lungs CTAB. Good air entry to the bases with no decreased or absent breath sounds. Musculoskeletal: Full range of motion to all extremities. No gross deformities appreciated. Neurologic:  Normal speech and language. No gross focal neurologic deficits are appreciated.  Skin:  Skin is warm, dry and intact. No rash noted. Psychiatric: Mood and affect are normal. Speech and behavior are normal. Patient exhibits appropriate insight and judgement.   ____________________________________________   LABS (all labs ordered are listed, but only abnormal results are displayed)  Labs Reviewed - No data to display ____________________________________________  EKG   ____________________________________________  RADIOLOGY   No results found.  ____________________________________________    PROCEDURES  Procedure(s) performed:    Procedures    Medications  lidocaine (XYLOCAINE) 2 % viscous mouth solution 15 mL (not administered)  ketorolac (TORADOL) 30 MG/ML injection 30 mg (30 mg Intramuscular Given 11/27/16 1618)     ____________________________________________   INITIAL IMPRESSION / ASSESSMENT AND PLAN / ED COURSE  Pertinent labs & imaging results that were available during my care of the patient were reviewed by me and considered in my medical decision making (see chart for details).  Review of the [place] CSRS was performed in accordance of the [facility] prior to dispensing any controlled drugs.     Assessment and plan Dental pain Patient presents to the emergency department with a broken superior 1.  Patient was given viscous lidocaine and an injection of Toradol in the emergency department.  He was discharged with Toradol.  Patient was also given viscous lidocaine.  He was  discharged with amoxicillin.  Patient was advised to make an dentist as soon as possible.  Vital signs were reassuring prior to discharge aside from hypertension.  All patient questions were answered.     ____________________________________________  FINAL CLINICAL IMPRESSION(S) / ED DIAGNOSES  Final diagnoses:  Pain, dental      NEW MEDICATIONS STARTED DURING THIS VISIT:  ED Discharge Orders        Ordered    ketorolac (TORADOL) 10 MG tablet  Every 6 hours PRN     11/27/16 1612    amoxicillin (AMOXIL) 500 MG tablet  3 times daily     11/27/16 1612          This chart was dictated using voice recognition software/Dragon. Despite best efforts to proofread, errors can occur which can change the meaning. Any change was purely unintentional.    Orvil FeilWoods, Jarelyn Bambach M, PA-C 11/27/16 1622    Dorothea GlassmanMalinda, Paul  F, MD 11/27/16 [phone]

## 2016-11-27 NOTE — Discharge Instructions (Signed)
OPTIONS FOR DENTAL FOLLOW UP CARE ° °[place] Department of Health and Human Services - Local Safety Net Dental Clinics °http://www.ncdhhs.gov/dph/oralhealth/services/safetynetclinics.htm °  °[facility] (336-562-3123) ° °[facility] ([phone]) ° °[facility] ([phone] ext [phone]) ° °[facility] ([phone]) ° °[facility] ([phone]) °This clinic caters to the indigent population and is on a lottery system. °Location: °[facility], [address], [place] °Clinic Hours: °Wednesdays from 6pm - 9pm, patients seen by a lottery system. °For dates, call or go to www.med.[facility].edu/[facility]/patients/Dental-[facility] °Services: °Cleanings, fillings and simple extractions. °Payment Options: °DENTAL WORK IS FREE OF CHARGE. Bring proof of income or support. °Best way to get seen: °Arrive at 5:15 pm - this is a lottery, NOT first come/first serve, so arriving earlier will not increase your chances of being seen. °  °  °[facility] °[phone] °Select option 1 for emergencies °  °Location: °[facility], [address], [place] °Clinic Hours: °No walk-ins accepted - call the day before to schedule an appointment. °Check in times are 9:30 am and 1:30 pm. °Services: °Simple extractions, temporary fillings, pulpectomy/pulp debridement, uncomplicated abscess drainage. °Payment Options: °PAYMENT IS DUE AT THE TIME OF SERVICE.  Fee is usually $100-200, additional surgical procedures (e.g. abscess drainage) may be extra. °Cash, checks, Visa/MasterCard accepted.  Can file Medicaid if patient is covered for dental - patient should call case worker to check. °No discount for [facility] patients. °Best way to get seen: °MUST call the day before and get onto the schedule. Can usually be seen the next 1-2 days. No walk-ins accepted. °  °  °[facility] °[phone] °   °Location: °[facility], [address], [place] °Clinic Hours: °M, W, Th, F 8am or 1:30pm, Tues 9a or 1:30 - first come/first served. °Services: °Simple extractions, temporary fillings, uncomplicated abscess drainage.  You do not need to be an [place] resident. °Payment Options: °PAYMENT IS DUE AT THE TIME OF SERVICE. °Dental insurance, otherwise sliding scale - bring proof of income or support. °Depending on income and treatment needed, cost is usually $50-200. °Best way to get seen: °Arrive early as it is first come/first served. °  °  °[facility] °[phone] °  °Location: °[address] °Clinic Hours: °Mon-Thu 8a-5p °Services: °Most basic dental services including extractions and fillings. °Payment Options: °PAYMENT IS DUE AT THE TIME OF SERVICE. °Sliding scale, up to 50% off - bring proof if income or support. °Medicaid with dental option accepted. °Best way to get seen: °Call to schedule an appointment, can usually be seen within 2 weeks OR they will try to see walk-ins - show up at 8a or 2p (you may have to wait). °  °  °[facility] °[phone] °[place] RESIDENTS ONLY °  °Location: °[facility], [address], [place], [place] [address] °Clinic Hours: By appointment only. °Monday - Thursday 8am-5pm, Friday 8am-12pm °Services: Cleanings, fillings, extractions. °Payment Options: °PAYMENT IS DUE AT THE TIME OF SERVICE. °Cash, Visa or MasterCard. Sliding scale - $30 minimum per service. °Best way to get seen: °Come in to office, complete packet and make an appointment - need proof of income °or support monies for each household member and proof of [place] residence. °Usually takes about a month to get in. °  °  °[facility] °[phone] °  °Location: °[address]   [place] °Clinic Hours: Walk-in Urgent Care Dental Services are offered Monday-Friday  mornings only. °The numbers of emergencies accepted daily is limited to the number of °providers available. °Maximum 15 - Mondays, Wednesdays & Thursdays °Maximum 10 - Tuesdays & Fridays °Services: °You do not need to be a [place] resident to be seen for a dental emergency. °Emergencies are defined as pain, swelling, abnormal bleeding, or dental trauma. Walkins will receive x-rays if needed. °NOTE: Dental cleaning is not an emergency. °Payment Options: °PAYMENT IS DUE AT THE TIME OF SERVICE. °Minimum co-pay is $40.00 for uninsured patients. °Minimum co-pay is $3.00 for Medicaid with dental coverage. °Dental Insurance is accepted and must be presented at time of visit. °Medicare does not cover dental. °Forms of payment: Cash, credit card, checks. °Best way to get seen: °If not previously registered with the clinic, walk-in dental registration begins at 7:15 am and is on a first come/first serve basis. °If previously registered with the clinic, call to make an appointment. °  °  °[facility] °[phone] °[place] RESIDENTS ONLY °  °Location: °[address], [place], [place] °Clinic Hours: °Mon-Thu 10a-2p °Services: Extractions only! °Payment Options: °FREE (donations accepted) - bring proof of income or support °Best way to get seen: °Call and schedule an appointment OR come at 8am on the 1st Monday of every month (except for holidays) when it is first come/first served. °  °  °[facility] °[phone] °  °Location: °[address], [place] °Clinic Hours: °Friday mornings °Services, Payment Options, Best way to get seen: °Call for info °

## 2016-11-27 NOTE — ED Triage Notes (Signed)
Pt states he has cracked tooth on the right upper side of his mouth x 2 days.  Pt c/o pain when chewing and having liquid on that side of the mouth.  Pt states he took Tylenol ES x2 tabs about 2 hours ago.

## 2017-02-01 ENCOUNTER — Encounter: Payer: Self-pay | Admitting: Emergency Medicine

## 2017-02-01 ENCOUNTER — Emergency Department
Admission: EM | Admit: 2017-02-01 | Discharge: 2017-02-01 | Disposition: A | Discharge status: Home / Self Care | Payer: Self-pay | Attending: Emergency Medicine | Admitting: Emergency Medicine

## 2017-02-01 ENCOUNTER — Other Ambulatory Visit: Payer: Self-pay

## 2017-02-01 DIAGNOSIS — K029 Dental caries, unspecified: Secondary | ICD-10-CM | POA: Insufficient documentation

## 2017-02-01 DIAGNOSIS — Z79899 Other long term (current) drug therapy: Secondary | ICD-10-CM | POA: Insufficient documentation

## 2017-02-01 DIAGNOSIS — I10 Essential (primary) hypertension: Secondary | ICD-10-CM | POA: Insufficient documentation

## 2017-02-01 DIAGNOSIS — E119 Type 2 diabetes mellitus without complications: Secondary | ICD-10-CM | POA: Insufficient documentation

## 2017-02-01 DIAGNOSIS — F17210 Nicotine dependence, cigarettes, uncomplicated: Secondary | ICD-10-CM | POA: Insufficient documentation

## 2017-02-01 MED ORDER — LIDOCAINE VISCOUS 2 % MT SOLN
15.0000 mL | Freq: Once | OROMUCOSAL | Status: AC
  Administered 2017-02-01: 15 mL via OROMUCOSAL

## 2017-02-01 MED ORDER — IBUPROFEN 600 MG PO TABS: 600.0000 mg | ORAL_TABLET | Freq: Three times a day (TID) | ORAL | 0 refills | Status: AC | PRN

## 2017-02-01 MED ORDER — AMOXICILLIN 500 MG PO CAPS: 500.0000 mg | ORAL_CAPSULE | Freq: Three times a day (TID) | ORAL | 0 refills | Status: AC

## 2017-02-01 MED ORDER — LIDOCAINE VISCOUS 2 % MT SOLN
OROMUCOSAL | Status: AC
  Administered 2017-02-01: 15 mL via OROMUCOSAL
  Filled 2017-02-01: qty 15

## 2017-02-01 NOTE — ED Provider Notes (Signed)
[facility] Emergency Department Provider Note  ____________________________________________   First MD Initiated Contact with Patient 02/01/17 [phone]     (approximate)  I have reviewed the triage vital signs and the nursing notes.   HISTORY  Chief Complaint Dental Pain   HPI Ricky Farrell is a 32 y.o. male is here complained of right upper molar pain that started this morning.  Patient states that he plans to go to the dentist that is available at [facility].  He has not taken any over-the-counter medication for his pain.  He rates his pain as an 8/10.   Past Medical History:  Diagnosis Date  . Diabetes mellitus without complication (HCC)   . Diverticulitis   . Hypertension     Patient Active Problem List   Diagnosis Date Noted  . Hypertension 07/02/2016  . 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 Chest pain 03/14/2016  . Hypertensive urgency 03/14/2016    History reviewed. No pertinent surgical history.  Prior to Admission medications   Medication Sig Start Date End Date Taking? Authorizing Provider  amLODipine (NORVASC) 10 MG tablet Take 1 tablet (10 mg total) by mouth daily. 06/18/16   Steele Sizer, MD  amoxicillin (AMOXIL) 500 MG capsule Take 1 capsule (500 mg total) by mouth 3 (three) times daily. 02/01/17   Tommi Rumps, PA-C  cloNIDine (CATAPRES) 0.2 MG tablet TAKE ONE TABLET BY MOUTH 2 TIMES A DAY 08/19/16   Virl Axe, MD  cloNIDine (CATAPRES) 0.3 MG tablet Take 1 tablet (0.3 mg total) by mouth 2 (two) times daily. 07/14/16   Doles-Johnson, Teah, NP  enalapril (VASOTEC) 20 MG tablet Take 1 tablet (20 mg total) by mouth 2 (two) times daily. 07/02/16   Johnson, Megan P, DO  glipiZIDE (GLUCOTROL) 5 MG tablet Take 1 tablet (5 mg total) by mouth 2 (two) times daily before a meal. 06/07/16 09/05/16  Isa Rankin, MD  ibuprofen (ADVIL,MOTRIN) 600 MG tablet Take 1 tablet (600 mg total) by mouth every 8 (eight)  hours as needed. 02/01/17   Tommi Rumps, PA-C  metoprolol tartrate (LOPRESSOR) 50 MG tablet TAKE ONE TABLET BY MOUTH 2 TIMES A DAY 08/19/16   Virl Axe, MD  spironolactone (ALDACTONE) 50 MG tablet Take 1 tablet (50 mg total) by mouth 2 (two) times daily. 08/06/16   Maple Hudson., MD    Allergies Patient has no known allergies.  Family History  Problem Relation Age of Onset  . Hypertension Mother   . Diabetes Mother     Social History Social History   Tobacco Use  . Smoking status: Current Every Day Smoker    Packs/day: 0.10    Types: Cigarettes  . Smokeless tobacco: Never Used  Substance Use Topics  . Alcohol use: No  . Drug use: No    Review of Systems Constitutional: No fever/chills Eyes: No visual changes. ENT: Positive dental pain. Cardiovascular: Denies chest pain. Respiratory: Denies shortness of breath. Gastrointestinal:   No nausea, no vomiting.  Musculoskeletal: Negative for muscle pain. Neurological: Negative for headaches, focal weakness or numbness. ___________________________________________   PHYSICAL EXAM:  VITAL SIGNS: ED Triage Vitals  Enc Vitals Group     BP 02/01/17 0808 (!) 145/85     Pulse Rate 02/01/17 0808 73     Resp 02/01/17 0808 20     Temp 02/01/17 0808 98.1 F (36.7 C)     Temp Source 02/01/17 0808 Oral     SpO2 02/01/17  0808 100 %     Weight 02/01/17 0807 245 lb (111.1 kg)     Height 02/01/17 0807 5\' 6"  (1.676 m)     Head Circumference --      Peak Flow --      Pain Score 02/01/17 0806 8     Pain Loc --      Pain Edu? --      Excl. in GC? --    Constitutional: Alert and oriented. Well appearing and in no acute distress. Eyes: Conjunctivae are normal.  Head: Atraumatic. Nose: No congestion/rhinnorhea. Mouth/Throat: Mucous membranes are moist.  Oropharynx non-erythematous.  Right upper molar with large cavity present.  No obvious abscess or drainage noted however area is moderately tender to palpation with a  tongue depressor. Neck: No stridor.   Hematological/Lymphatic/Immunilogical: No cervical lymphadenopathy. Cardiovascular: Normal rate, regular rhythm. Grossly normal heart sounds.  Good peripheral circulation. Respiratory: Normal respiratory effort.  No retractions. Lungs CTAB. Musculoskeletal: Moves upper and lower extremities without any difficulty.  Normal gait was noted. Neurologic:  Normal speech and language. No gross focal neurologic deficits are appreciated.  Skin:  Skin is warm, dry and intact.  Psychiatric: Mood and affect are normal. Speech and behavior are normal.  ____________________________________________   LABS (all labs ordered are listed, but only abnormal results are displayed)  Labs Reviewed - No data to display  PROCEDURES  Procedure(s) performed: None  Procedures  Critical Care performed: No  ____________________________________________   INITIAL IMPRESSION / ASSESSMENT AND PLAN / ED COURSE Patient was given a prescription from amoxicillin 500 mg 3 times daily for 10 days and ibuprofen 600 mg 3 times daily with food as needed for pain.  He is encouraged to follow-up with the dental clinic that he mentioned. ____________________________________________   FINAL CLINICAL IMPRESSION(S) / ED DIAGNOSES  Final diagnoses:  Pain due to dental caries     ED Discharge Orders        Ordered    amoxicillin (AMOXIL) 500 MG capsule  3 times daily     02/01/17 0828    ibuprofen (ADVIL,MOTRIN) 600 MG tablet  Every 8 hours PRN     02/01/17 [id number]       Note:  This document was prepared using Dragon voice recognition software and may include unintentional dictation errors.    Tommi RumpsSummers, Lyndsy Gilberto L, PA-C 02/01/17 1048    Arnaldo NatalMalinda, Paul F, MD 02/01/17 [phone]

## 2017-02-01 NOTE — ED Triage Notes (Signed)
C/o right upper molar pain starting this morning. Ambulatory. NAD. Respirations unlabored.

## 2017-02-01 NOTE — Discharge Instructions (Signed)
Call make an appointment with your dentist. Ibuprofen 600 mg 3 times daily with food.  Amoxicillin 500 mg 3 times daily for 10 days.

## 2017-02-01 NOTE — ED Notes (Signed)
See triage note   States he woke up about 4 am with dental pain  States he has a broken tooth on the right

## 2017-05-25 ENCOUNTER — Emergency Department
Admission: EM | Admit: 2017-05-25 | Discharge: 2017-05-25 | Disposition: A | Discharge status: Home / Self Care | Payer: Self-pay | Attending: Emergency Medicine | Admitting: Emergency Medicine

## 2017-05-25 ENCOUNTER — Encounter: Payer: Self-pay | Admitting: Emergency Medicine

## 2017-05-25 ENCOUNTER — Emergency Department: Payer: Self-pay

## 2017-05-25 ENCOUNTER — Other Ambulatory Visit: Payer: Self-pay

## 2017-05-25 DIAGNOSIS — E119 Type 2 diabetes mellitus without complications: Secondary | ICD-10-CM | POA: Insufficient documentation

## 2017-05-25 DIAGNOSIS — R079 Chest pain, unspecified: Secondary | ICD-10-CM | POA: Insufficient documentation

## 2017-05-25 DIAGNOSIS — F17210 Nicotine dependence, cigarettes, uncomplicated: Secondary | ICD-10-CM | POA: Insufficient documentation

## 2017-05-25 DIAGNOSIS — Z79899 Other long term (current) drug therapy: Secondary | ICD-10-CM | POA: Insufficient documentation

## 2017-05-25 DIAGNOSIS — I10 Essential (primary) hypertension: Secondary | ICD-10-CM | POA: Insufficient documentation

## 2017-05-25 LAB — CBC WITH DIFFERENTIAL/PLATELET
Basophils Absolute: 0.1 10*3/uL (ref 0–0.1)
Basophils Relative: 1 %
Eosinophils Absolute: 0.2 10*3/uL (ref 0–0.7)
Eosinophils Relative: 2 %
HCT: 39.2 % — ABNORMAL LOW (ref 40.0–52.0)
HEMOGLOBIN: 13.6 g/dL (ref 13.0–18.0)
LYMPHS PCT: 27 %
Lymphs Abs: 2.7 10*3/uL (ref 1.0–3.6)
MCH: 30.8 pg (ref 26.0–34.0)
MCHC: 34.8 g/dL (ref 32.0–36.0)
MCV: 88.6 fL (ref 80.0–100.0)
MONO ABS: 0.6 10*3/uL (ref 0.2–1.0)
Monocytes Relative: 6 %
NEUTROS ABS: 6.4 10*3/uL (ref 1.4–6.5)
Neutrophils Relative %: 64 %
Platelets: 262 10*3/uL (ref 150–440)
RBC: 4.42 MIL/uL (ref 4.40–5.90)
RDW: 12.9 % (ref 11.5–14.5)
WBC: 10 10*3/uL (ref 3.8–10.6)

## 2017-05-25 LAB — BASIC METABOLIC PANEL
Anion gap: 6 (ref 5–15)
BUN: 19 mg/dL (ref 6–20)
CHLORIDE: 102 mmol/L (ref 101–111)
CO2: 26 mmol/L (ref 22–32)
CREATININE: 0.99 mg/dL (ref 0.61–1.24)
Calcium: 9.3 mg/dL (ref 8.9–10.3)
GFR calc Af Amer: >60 mL/min (ref >60)
GFR calc non Af Amer: >60 mL/min (ref >60)
GLUCOSE: 182 mg/dL — AB (ref 65–99)
Potassium: 4.7 mmol/L (ref 3.5–5.1)
SODIUM: 134 mmol/L — AB (ref 135–145)

## 2017-05-25 LAB — TROPONIN I: Troponin I: <0.03 ng/mL (ref <0.03)

## 2017-05-25 MED ORDER — PANTOPRAZOLE SODIUM 20 MG PO TBEC: 20.0000 mg | DELAYED_RELEASE_TABLET | Freq: Every day | ORAL | 1 refills | Status: AC

## 2017-05-25 NOTE — ED Triage Notes (Signed)
Patient complaining of chest pain on the right when he lays down.  "Only when I'm laying down".  Describes the pain as "kind of sharp but quick and as soon as I sit up it goes away".  Also complaining of nausea, emesis yesterday AM only.

## 2017-05-25 NOTE — ED Notes (Signed)
Went in pt room to place on cardiac monitor, pt in bathroom.

## 2017-05-25 NOTE — ED Notes (Signed)
First Nurse Note:  Patient's symptoms reviewed with Dr. Gwinda Passe, orders entered.

## 2017-05-25 NOTE — Discharge Instructions (Signed)
You have been seen in the emergency department today for chest pain. Your workup has shown normal results. As we discussed please follow-up with your primary care physician in the next 1-2 days for recheck. Return to the emergency department for any further chest pain, trouble breathing, or any other symptom personally concerning to yourself.  Please call the number provided for cardiology to arrange a follow-up appointment for further evaluation and possible stress test.

## 2017-05-25 NOTE — ED Provider Notes (Signed)
[name] Emergency Department Provider Note  Time seen: 10:17 AM  I have reviewed the triage vital signs and the nursing notes.   HISTORY  Chief Complaint Chest Pain and Nausea    HPI Ricky Farrell is a 32 y.o. male with a past medical history of diabetes, hypertension, presents to the emergency department for chest pain.  According to the patient for the past several days he has been experiencing chest pain mostly at night when lying down, states it will go away when he is up and moving around.  Denies any shortness of breath, does state intermittent nausea states he will awaken with heartburn at times and sometimes need to vomit in the morning and then feels much better.  Is not taking anything for heartburn.  Denies any leg pain or swelling, fever, cough or congestion.  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   Past Medical History:  Diagnosis Date  . Diabetes mellitus without complication (HCC)   . Diverticulitis   . Hypertension     Patient Active Problem List   Diagnosis Date Noted  . Hypertension 07/02/2016  . 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 Chest pain 03/14/2016  . Hypertensive urgency 03/14/2016    History reviewed. No pertinent surgical history.  Prior to Admission medications   Medication Sig Start Date End Date Taking? Authorizing Provider  amLODipine (NORVASC) 10 MG tablet Take 1 tablet (10 mg total) by mouth daily. 06/18/16   Steele Sizer, MD  amoxicillin (AMOXIL) 500 MG capsule Take 1 capsule (500 mg total) by mouth 3 (three) times daily. 02/01/17   Tommi Rumps, PA-C  cloNIDine (CATAPRES) 0.2 MG tablet TAKE ONE TABLET BY MOUTH 2 TIMES A DAY 08/19/16   Virl Axe, MD  cloNIDine (CATAPRES) 0.3 MG tablet Take 1 tablet (0.3 mg total) by mouth 2 (two) times daily. 07/14/16   Doles-Johnson, Teah, NP  enalapril (VASOTEC) 20 MG tablet  Take 1 tablet (20 mg total) by mouth 2 (two) times daily. 07/02/16   Johnson, Megan P, DO  glipiZIDE (GLUCOTROL) 5 MG tablet Take 1 tablet (5 mg total) by mouth 2 (two) times daily before a meal. 06/07/16 09/05/16  Isa Rankin, MD  ibuprofen (ADVIL,MOTRIN) 600 MG tablet Take 1 tablet (600 mg total) by mouth every 8 (eight) hours as needed. 02/01/17   Tommi Rumps, PA-C  metoprolol tartrate (LOPRESSOR) 50 MG tablet TAKE ONE TABLET BY MOUTH 2 TIMES A DAY 08/19/16   Virl Axe, MD  spironolactone (ALDACTONE) 50 MG tablet Take 1 tablet (50 mg total) by mouth 2 (two) times daily. 08/06/16   Maple Hudson., MD    No Known Allergies  Family History  Problem Relation Age of Onset  . Hypertension Mother   . Diabetes Mother     Social History Social History   Tobacco Use  . Smoking status: Current Every Day Smoker    Packs/day: 0.10    Types: Cigarettes  . Smokeless tobacco: Never Used  Substance Use Topics  . Alcohol use: No  . Drug use: No    Review of Systems Constitutional: Negative for fever. Eyes: Negative for visual complaints ENT: Negative for recent illness/congestion Cardiovascular: Right-sided chest pain mostly at night intermittent over the past several nights. Respiratory: Negative for shortness of breath. Gastrointestinal: Negative for abdominal pain, vomiting  Genitourinary: Negative for urinary compaints  Musculoskeletal: Negative for leg pain or swelling Skin: Negative for skin complaints  Neurological: Negative for headache All other ROS negative  ____________________________________________   PHYSICAL EXAM:  VITAL SIGNS: ED Triage Vitals  Enc Vitals Group     BP 05/25/17 0742 (!) 146/97     Pulse Rate 05/25/17 0742 75     Resp 05/25/17 0742 16     Temp 05/25/17 0742 98 F (36.7 C)     Temp Source 05/25/17 0742 Oral     SpO2 05/25/17 0742 100 %     Weight 05/25/17 0743 240 lb (108.9 kg)     Height 05/25/17 0743  (1.702 m)      Head Circumference --      Peak Flow --      Pain Score 05/25/17 0742 7     Pain Loc --      Pain Edu? --      Excl. in GC? --    Constitutional: Alert and oriented. Well appearing and in no distress. Eyes: Normal exam ENT   Head: Normocephalic and atraumatic.   Mouth/Throat: Mucous membranes are moist. Cardiovascular: Normal rate, regular rhythm. No murmur Respiratory: Normal respiratory effort without tachypnea nor retractions. Breath sounds are cl Gastrointestinal: Soft and nontender. No distention.   Musculoskeletal: Nontender with normal range of motion in all extremities. No lower extremity tenderness or edema. Neurologic:  Normal speech and language. No gross focal neurologic deficits Skin:  Skin is warm, dry and intact.  Psychiatric: Mood and affect are normal.   ____________________________________________    EKG  EKG reviewed and interpreted by myself shows normal sinus rhythm 82 bpm with a narrow QRS, normal axis, normal intervals, no concerning ST changes.  ____________________________________________    RADIOLOGY  Chest x-ray negative  ____________________________________________   INITIAL IMPRESSION / ASSESSMENT AND PLAN / ED COURSE  Pertinent labs & imaging results that were available during my care of the patient were reviewed by me and considered in my medical decision making (see chart for details).  Presents the emergency department for intermittent right-sided chest pain mostly at night when lying flat, goes away when he sits up.  Denies any discomfort currently.  Denies any shortness of breath cough or congestion.  Denies nausea vomiting abdominal pain.  Differential would include ACS, chest wall pain/musculoskeletal pain, gastric reflux, pneumonia, pneumothorax.  Patient's x-ray is reassuring.  EKG is reassuring, labs are normal including negative troponin.  I discussed with the patient possible reflux whether or not that is the cause of the  discomfort is not clear but I believe the patient should be treated for his gastric reflux.  Patient states his mother had a heart attack in her mid 42s.  Given the family history I believe cardiology follow-up is warranted for possible stress test.  Patient agreeable to this plan of care.  Also discussed my normal chest pain return precautions.  ____________________________________________   FINAL CLINICAL IMPRESSION(S) / ED DIAGNOSES  Chest pain    Minna Antis, MD 05/25/17 1020

## 2017-05-25 NOTE — ED Notes (Signed)
Dr. Paduchowski at bedside.  

## 2017-05-25 NOTE — ED Notes (Signed)
Patient to Rm 1, Collyn RN aware.

## 2017-05-25 NOTE — ED Notes (Signed)
First Nurse Note:  Patient registered and walked away.

## 2017-05-25 NOTE — ED Triage Notes (Signed)
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

## 2017-05-25 NOTE — ED Notes (Signed)
First Nurse Note:  VSS.  Patient alert and oriented.  Skin warm and dry.  NAD.  Wait explained to patient, he verbalizes understanding.

## 2017-05-25 NOTE — ED Notes (Signed)
Went to check on pt, states he will be done in the bathroom soon, no complaints at this time.

## 2017-05-27 ENCOUNTER — Telehealth: Payer: Self-pay

## 2017-05-27 NOTE — Telephone Encounter (Signed)
Lmov for patient to call back  °He was seen in ED on 05/25/17  °  °Will call back at a later time  °

## 2017-06-03 NOTE — Telephone Encounter (Signed)
Lmov for patient to call back  °He was seen in ED on 05/25/17  °  °Will call back at a later time  °

## 2017-06-08 NOTE — Telephone Encounter (Signed)
Scheduled 7/22

## 2017-08-02 ENCOUNTER — Ambulatory Visit: Payer: Self-pay | Admitting: Cardiovascular Disease

## 2019-03-03 IMAGING — CT CT RENAL STONE PROTOCOL
2 of 4 series · 17 of 46 positions shown, 19 images · non-contrast
Comparison: None

CLINICAL DATA: Pain in the testicles radiating to groin, lower
abdomen and lower back since last night

EXAM:
CT ABDOMEN AND PELVIS WITHOUT CONTRAST
TECHNIQUE: Multidetector CT imaging of the abdomen and pelvis was performed
following the standard protocol without IV contrast. Sagittal and
coronal MPR images reconstructed from axial data set. Oral contrast
was not administered.

[Series 2: stone full standard · axial · 0.83mm/px · z∈[-434,+16]mm · 14 of 100 slices shown, 16 images]
[im 5/100  soft-tissue]
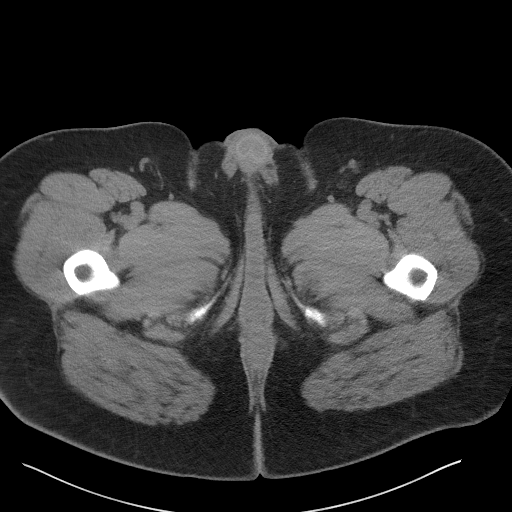
[im 5/100  bone]
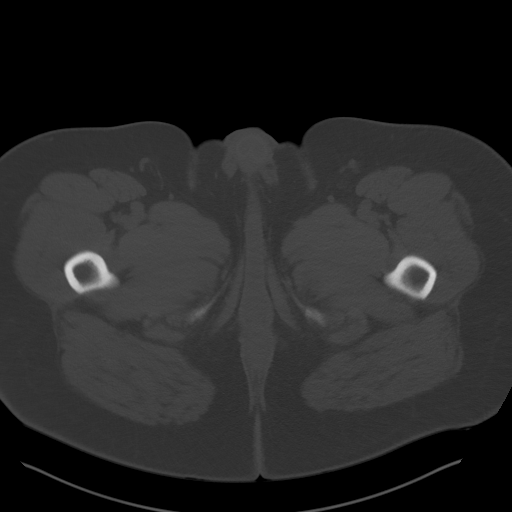
[im 13/100  soft-tissue]
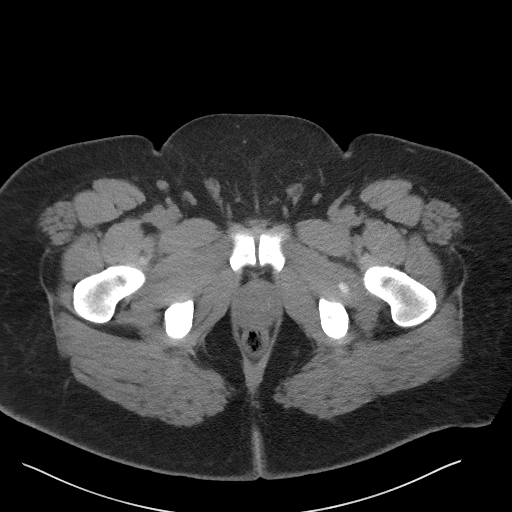
[im 18/100  soft-tissue]
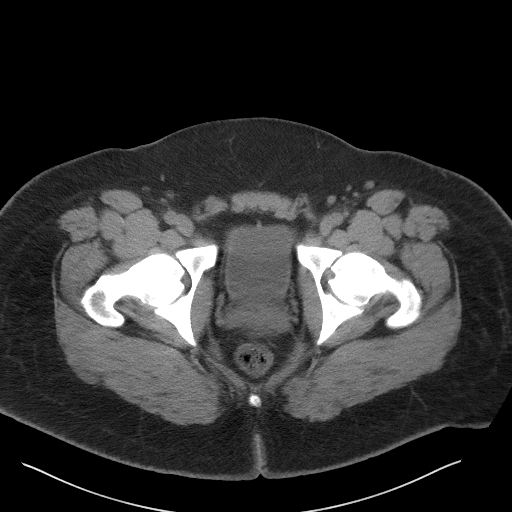
[im 26/100  soft-tissue]
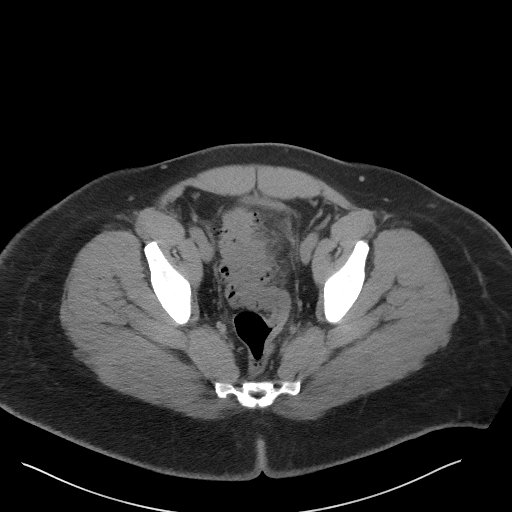
[im 35/100  soft-tissue]
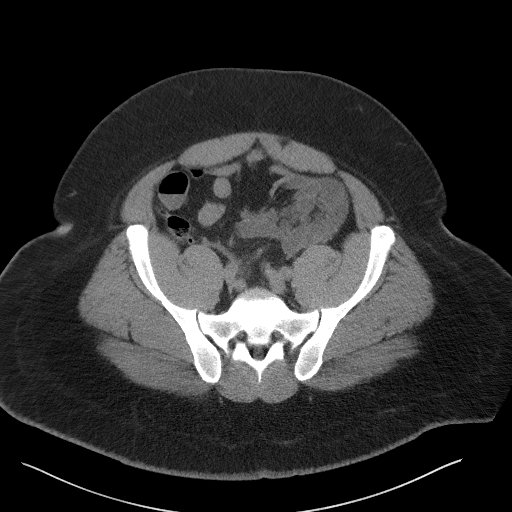
[im 39/100  soft-tissue]
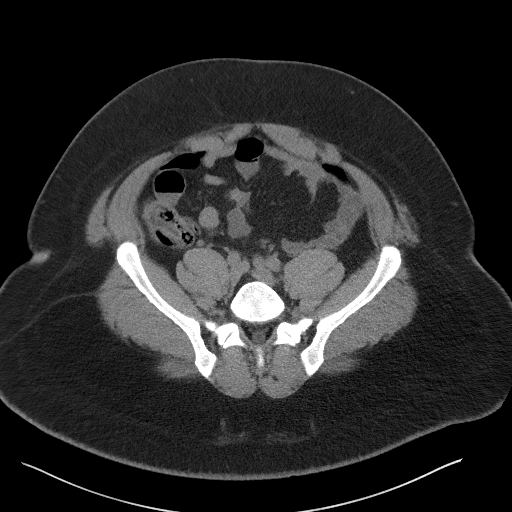
[im 48/100  soft-tissue]
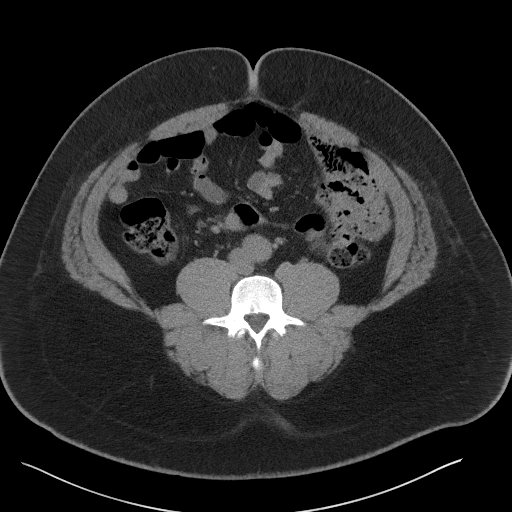
[im 52/100  soft-tissue]
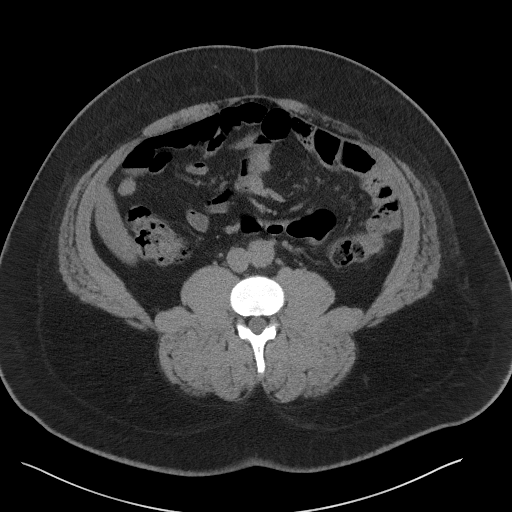
[im 61/100  soft-tissue]
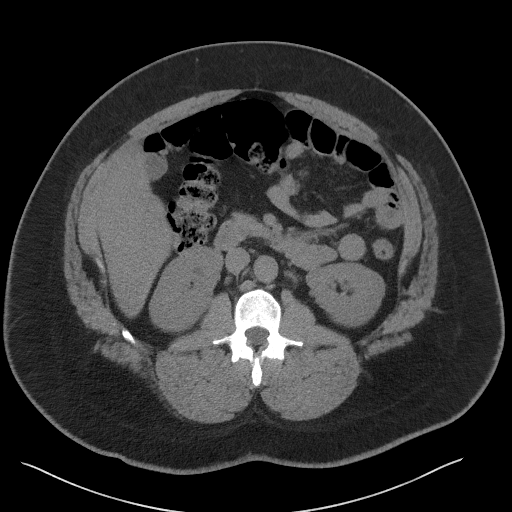
[im 61/100  bone]
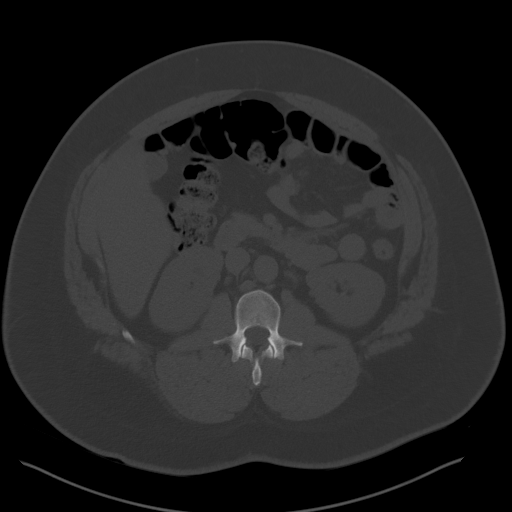
[im 65/100  soft-tissue]
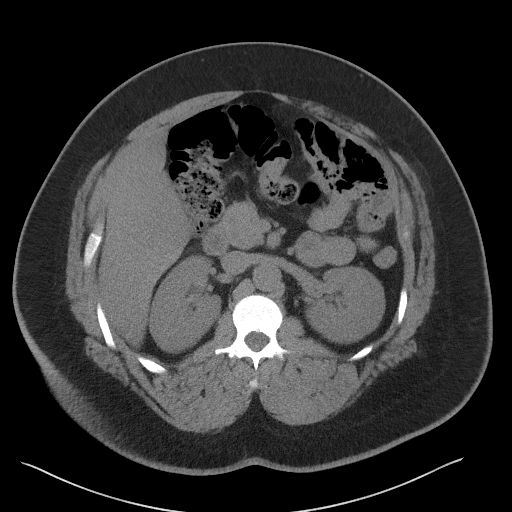
[im 74/100  soft-tissue]
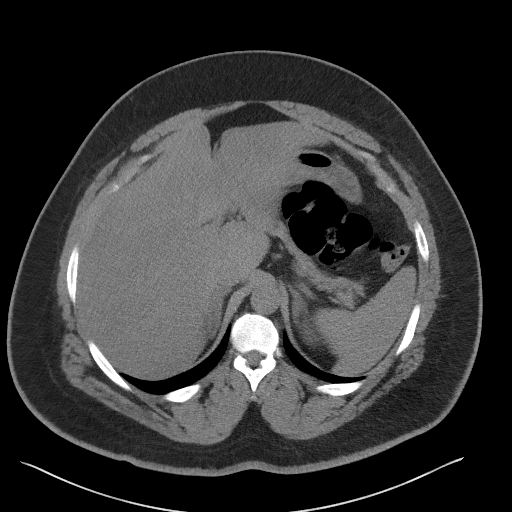
[im 82/100  soft-tissue]
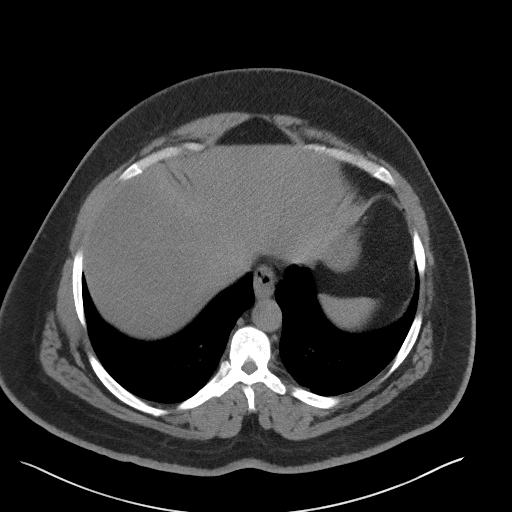
[im 87/100  soft-tissue]
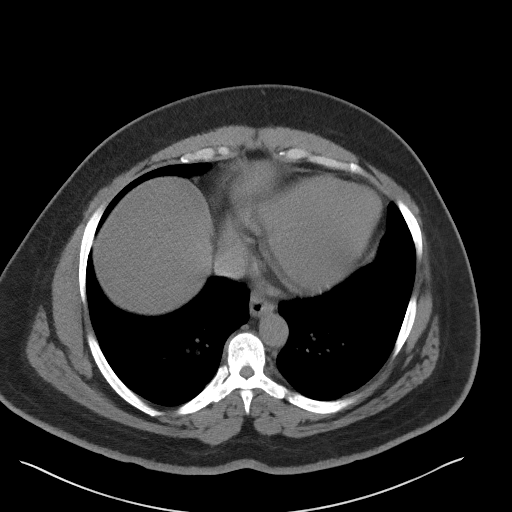
[im 95/100  soft-tissue]
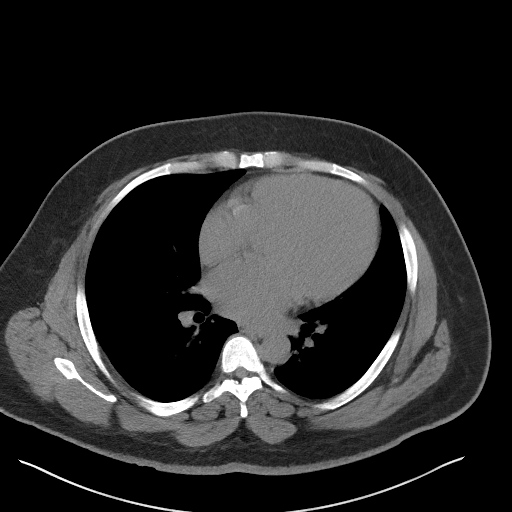

[Series 5: coronal · coronal · 0.83mm/px · 3 of 158 slices shown]
[im 53/158  soft-tissue]
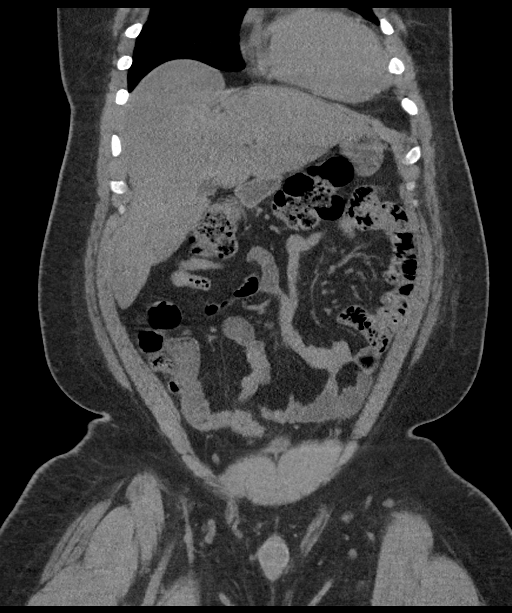
[im 70/158  soft-tissue]
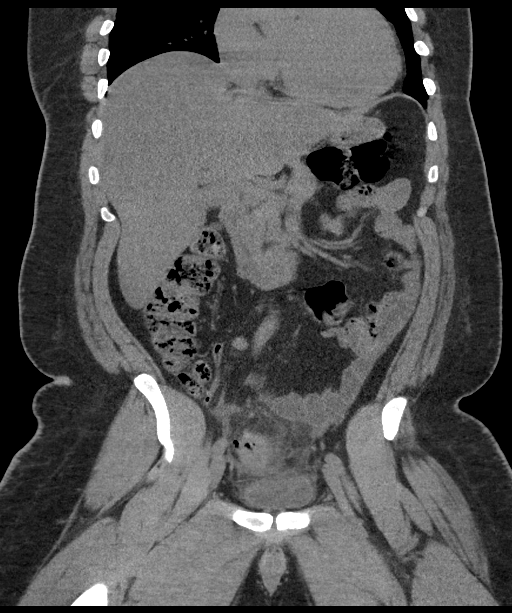
[im 88/158  soft-tissue]
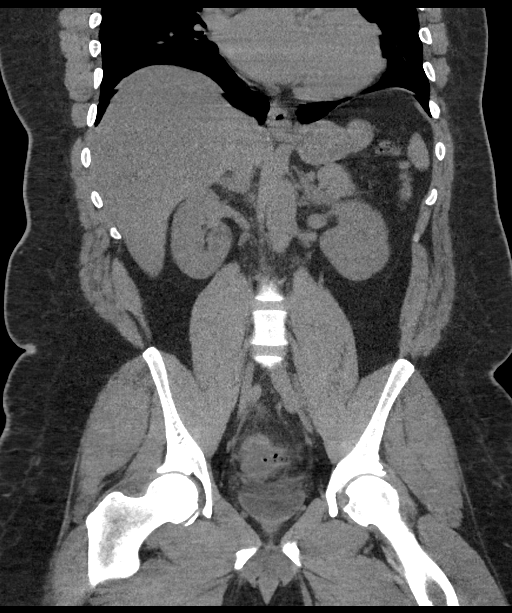

[17 of 46 positions shown; findings below may reference images not displayed]

FINDINGS: Lower chest: Minimal bibasilar atelectasis

Hepatobiliary: Gallbladder contracted. No focal hepatic
abnormalities.

Pancreas: Normal appearance

Spleen: Normal appearance

Adrenals/Urinary Tract: Adrenal glands, kidneys, ureters, and
bladder normal appearance

Stomach/Bowel: Normal appendix. Stomach decompressed. Small bowel
loops unremarkable. Distal sigmoid diverticulosis with sigmoid wall
thickening and pericolic inflammatory changes consistent with acute
diverticulitis. No definite extraluminal gas or discrete abscess
collection. Remainder of colon normal appearance.

Vascular/Lymphatic: Aorta normal caliber.  No adenopathy.

Reproductive: Unremarkable prostate gland and seminal vesicles.

Other: Small amount of free pelvic fluid. No definite free air. No
hernia.

Musculoskeletal: Bones unremarkable.
IMPRESSION: Sigmoid diverticulitis without definite evidence of perforation or
abscess.
